# Patient Record
Sex: Male | Born: 2000
Health system: Southern US, Community
[De-identification: ages and names within clinical notes are randomized; demographics above are authoritative.]

## PROBLEM LIST (undated history)

## (undated) DIAGNOSIS — F909 Attention-deficit hyperactivity disorder, unspecified type: Secondary | ICD-10-CM

## (undated) DIAGNOSIS — F419 Anxiety disorder, unspecified: Secondary | ICD-10-CM

## (undated) DIAGNOSIS — N200 Calculus of kidney: Secondary | ICD-10-CM

## (undated) DIAGNOSIS — J45909 Unspecified asthma, uncomplicated: Secondary | ICD-10-CM

## (undated) HISTORY — DX: Attention-deficit hyperactivity disorder, unspecified type: F90.9

## (undated) HISTORY — PX: OTHER SURGICAL HISTORY: SHX169

## (undated) HISTORY — DX: Anxiety disorder, unspecified: F41.9

## (undated) HISTORY — DX: Calculus of kidney: N20.0

## (undated) HISTORY — PX: TONSILLECTOMY: SUR1361

## (undated) HISTORY — PX: LITHOTRIPSY: SUR834

## (undated) HISTORY — PX: TYMPANOSTOMY TUBE PLACEMENT: SHX32

---
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
## 2019-01-07 ENCOUNTER — Telehealth: Payer: Self-pay | Admitting: Family Medicine

## 2019-01-07 ENCOUNTER — Ambulatory Visit (INDEPENDENT_AMBULATORY_CARE_PROVIDER_SITE_OTHER): Payer: Medicaid Other | Admitting: Family Medicine

## 2019-01-07 ENCOUNTER — Ambulatory Visit
Admission: RE | Admit: 2019-01-07 | Discharge: 2019-01-07 | Disposition: A | Discharge status: Home / Self Care | Payer: Medicaid Other | Source: Ambulatory Visit | Attending: Family Medicine | Admitting: Family Medicine

## 2019-01-07 ENCOUNTER — Other Ambulatory Visit: Payer: Self-pay

## 2019-01-07 ENCOUNTER — Encounter: Payer: Self-pay | Admitting: Family Medicine

## 2019-01-07 VITALS — BP 108/76 | HR 116 | Temp 101.1°F | Resp 18 | Wt 161.0 lb

## 2019-01-07 DIAGNOSIS — J40 Bronchitis, not specified as acute or chronic: Secondary | ICD-10-CM

## 2019-01-07 DIAGNOSIS — R509 Fever, unspecified: Secondary | ICD-10-CM

## 2019-01-07 DIAGNOSIS — R05 Cough: Secondary | ICD-10-CM | POA: Insufficient documentation

## 2019-01-07 DIAGNOSIS — M778 Other enthesopathies, not elsewhere classified: Secondary | ICD-10-CM

## 2019-01-07 DIAGNOSIS — R059 Cough, unspecified: Secondary | ICD-10-CM

## 2019-01-07 DIAGNOSIS — J45909 Unspecified asthma, uncomplicated: Secondary | ICD-10-CM

## 2019-01-07 LAB — POCT INFLUENZA A/B
INFLUENZA B, POC: NEGATIVE
Influenza A, POC: NEGATIVE

## 2019-01-07 MED ORDER — BECLOMETHASONE DIPROP HFA 40 MCG/ACT IN AERB: 2.0000 | INHALATION_SPRAY | Freq: Every day | RESPIRATORY_TRACT | 12 refills | Status: DC

## 2019-01-07 MED ORDER — ALBUTEROL SULFATE HFA 108 (90 BASE) MCG/ACT IN AERS: INHALATION_SPRAY | RESPIRATORY_TRACT | 3 refills | Status: DC

## 2019-01-07 MED ORDER — AZITHROMYCIN 250 MG PO TABS: ORAL_TABLET | ORAL | 0 refills | Status: AC

## 2019-01-07 NOTE — Progress Notes (Addendum)
Patient: Joshua Green Male    DOB: 11/25/2001   18 y.o.   MRN: [id number] Visit Date: 01/07/2019  Today's Provider: Mila Merry, MD   Chief Complaint  Patient presents with  . Arm Pain    x 3 days  . Cough    x 1 month   Subjective:     Arm Pain   Incident onset: 3 days ago. The injury mechanism is unknown (patient does weight lifting at school). The pain has been constant since the incident. The symptoms are aggravated by movement. He has tried nothing for the symptoms.  Cough  This is a recurrent problem. Episode onset: 1 month ago. The problem has been gradually worsening (symptoms worsened 3 days ago). The cough is productive of sputum (light green sputum). Associated symptoms include a fever (99.0), headaches, myalgias, nasal congestion, postnasal drip and rhinorrhea. Pertinent negatives include no chills, ear congestion, ear pain, hemoptysis, sore throat, shortness of breath, sweats or wheezing. Treatments tried: Mucinex. The treatment provided mild relief.   He has long history of asthma. Previously controlled on fluticasone maintenance inhaler. However his insurance no longer covers it. Since being off of maintenance inhaler he has been having to use albuterol inhaler 2-3 times a week. Has been taking montelukast and cetirizine consistently.   Allergies  Allergen Reactions  . Amoxicillin-Pot Clavulanate   . Ceftin  [Cefuroxime Axetil] Rash     Current Outpatient Medications:  .  albuterol (PROAIR HFA) 108 (90 BASE) MCG/ACT inhaler, Inhale 2 puffs into the lungs. before exercise, and as needed every 6 hours, Disp: , Rfl:  .  Beclomethasone Diprop HFA (QVAR REDIHALER) 40 MCG/ACT AERB, Inhale 2 puffs into the lungs daily., Disp: 10.6 g, Rfl: 12 .  cetirizine (ZYRTEC) 10 MG tablet, Take 1 tablet (10 mg total) by mouth daily., Disp: 30 tablet, Rfl: 6 .  fluticasone (FLONASE ALLERGY RELIEF) 50 MCG/ACT nasal spray, Place 2 sprays into both nostrils daily., Disp: 16 g,  Rfl: 0 .  fluticasone (FLOVENT HFA) 110 MCG/ACT inhaler, Inhale 2 puffs into the lungs daily., Disp: 1 Inhaler, Rfl: 12 .  ibuprofen (ADVIL,MOTRIN) 600 MG tablet, Take 600 mg by mouth every 6 (six) hours as needed., Disp: , Rfl:  .  methylphenidate (CONCERTA) 54 MG PO CR tablet, Take 1 tablet (54 mg total) by mouth every morning., Disp: 30 tablet, Rfl: 0 .  montelukast (SINGULAIR) 10 MG tablet, TAKE 1 TABLET BY MOUTH EVERY DAY, Disp: 30 tablet, Rfl: 11 .  potassium citrate (UROCIT-K) 10 MEQ (1080 MG) SR tablet, TAKE 1 TABLET TWICE DAILY, Disp: 60 tablet, Rfl: 11 .  sertraline (ZOLOFT) 25 MG tablet, Take 1 tablet (25 mg total) by mouth daily., Disp: 30 tablet, Rfl: 5  Review of Systems  Constitutional: Positive for fatigue and fever (99.0). Negative for chills.  HENT: Positive for postnasal drip and rhinorrhea. Negative for ear pain and sore throat.   Respiratory: Positive for cough. Negative for hemoptysis, shortness of breath and wheezing.   Musculoskeletal: Positive for myalgias.  Neurological: Positive for headaches.    Social History   Tobacco Use  . Smoking status: Never Smoker  . Smokeless tobacco: Never Used  Substance Use Topics  . Alcohol use: No      Objective:   BP 108/76 (BP Location: Left Arm, Patient Position: Sitting, Cuff Size: Normal)   Pulse (!) 116   Temp (!) 101.1 F (38.4 C)   Resp 18   Wt 161 lb (  73 kg)   SpO2 96% Comment: room air  BMI 27.64 kg/m    Physical Exam  General Appearance:    Alert, cooperative, no distress  HENT:   ENT exam normal, no neck nodes or sinus tenderness  Eyes:    PERRL, conjunctiva/corneas clear, EOM's intact       Lungs:     Occasional expiratory wheeze, no rales, , respirations unlabored  Heart:    Regular rate and rhythm  Neurologic:   Awake, alert, oriented x 3. No apparent focal neurological           defect.   MS:   Tender over posterior distal upper arm. Pain with elbow extension.    Results for orders placed or  performed in visit on 01/07/19  POCT Influenza A/B  Result Value Ref Range   Influenza A, POC Negative Negative   Influenza B, POC Negative Negative     Dg Chest 2 View  Result Date: 01/07/2019 CLINICAL DATA:  Nonproductive cough for 2 months.  Asthma. EXAM: CHEST - 2 VIEW COMPARISON:  08/08/2017 FINDINGS: The heart size and mediastinal contours are within normal limits. Both lungs are clear. The visualized skeletal structures are unremarkable. IMPRESSION: Normal exam. Electronically Signed   By: Myles RosenthalJohn  Stahl M.D.   On: 01/07/2019 16:18       Assessment & Plan    1. Cough  - DG Chest 2 View; Future - POCT Influenza A/B  2. Fever, unspecified fever cause   3. Bronchitis  - azithromycin (ZITHROMAX) 250 MG tablet; 2 by mouth today, then 1 daily for 4 days  Dispense: 6 tablet; Refill: 0  4. Tendonitis of elbow, left  Counseled regarding relative rest, application of ice and OTC NSAIDs.   5. Asthma Worse since being off of maintenance inhaler due to failure of insurance to cover medication. Now requiring rescue inhaler nearly every day.  It looks like QVar is on formulary. New prescription for Qvar 40 sent to pharmacy.     Mila Merryonald Zalen Sequeira, MD  [facility]

## 2019-01-07 NOTE — Telephone Encounter (Signed)
Mom calling back for chest xray report from this morning  Cb#[phone]  thanks teri

## 2019-01-07 NOTE — Telephone Encounter (Signed)
Please see result note 

## 2019-01-07 NOTE — Telephone Encounter (Signed)
Patient's mom Dawn advised as verbally voiced understanding. Dawn states that she forgot to mention that he needs refills on his inhalers. She also states that he needs a new nebulizer machine so that he can do breathing treatments in the future. Please advise.

## 2019-01-07 NOTE — Telephone Encounter (Signed)
Calling for chest Xray results. Please call mother of pt back asap.  Thanks, [employer]

## 2019-01-07 NOTE — Patient Instructions (Signed)
.   Go to the [facility] on [address] for a chest Xray   Take 3 (three) 200mg  ibuprofen tablets every 4 hours for the next 2-3 days for your arm   Apply ice pack to your arm twice a day every day for the next week, and after weight lifting   Avoid elbow extension exercises for 7-10 days

## 2019-01-07 NOTE — Telephone Encounter (Signed)
-----   Message from Malva Limesonald E Fisher, MD sent at 01/07/2019  5:00 PM EST ----- [facility] [address]

## 2019-01-08 ENCOUNTER — Emergency Department
Admission: EM | Admit: 2019-01-08 | Discharge: 2019-01-08 | Disposition: A | Discharge status: Home / Self Care | Payer: Medicaid Other | Attending: Emergency Medicine | Admitting: Emergency Medicine

## 2019-01-08 ENCOUNTER — Other Ambulatory Visit: Payer: Self-pay

## 2019-01-08 DIAGNOSIS — Z5321 Procedure and treatment not carried out due to patient leaving prior to being seen by health care provider: Secondary | ICD-10-CM | POA: Insufficient documentation

## 2019-01-08 DIAGNOSIS — R05 Cough: Secondary | ICD-10-CM | POA: Diagnosis not present

## 2019-01-08 NOTE — ED Triage Notes (Addendum)
Pt states was diagnosed with bronchitis yesterday. Mother states was started on z pack for same. Pt states he continues to cough this am. Pt appears in no acute distress. Pt was tested for flu at urgent care with negative results per mother. Pt masked in triage.

## 2019-01-26 ENCOUNTER — Other Ambulatory Visit: Payer: Self-pay | Admitting: Family Medicine

## 2019-01-26 MED ORDER — METHYLPHENIDATE HCL ER (OSM) 54 MG PO TBCR: 54.0000 mg | EXTENDED_RELEASE_TABLET | ORAL | 0 refills | Status: DC

## 2019-01-26 NOTE — Telephone Encounter (Signed)
Pt needing a refill on: methylphenidate (CONCERTA) 54 MG PO CR tablet  Please fill at: [facility] [address] ([address]), [place] - [address] [phone] (Phone) [phone] (Fax)   Thanks, [employer]

## 2019-02-16 ENCOUNTER — Other Ambulatory Visit: Payer: Self-pay | Admitting: Family Medicine

## 2019-02-28 ENCOUNTER — Other Ambulatory Visit: Payer: Self-pay

## 2019-02-28 MED ORDER — METHYLPHENIDATE HCL ER (OSM) 54 MG PO TBCR: 54.0000 mg | EXTENDED_RELEASE_TABLET | ORAL | 0 refills | Status: DC

## 2019-02-28 NOTE — Telephone Encounter (Signed)
Patient's mom Dawn called requsting a refill. ([facility] [address]

## 2019-03-28 ENCOUNTER — Other Ambulatory Visit: Payer: Self-pay | Admitting: Family Medicine

## 2019-03-28 MED ORDER — METHYLPHENIDATE HCL ER (OSM) 54 MG PO TBCR: 54.0000 mg | EXTENDED_RELEASE_TABLET | ORAL | 0 refills | Status: DC

## 2019-03-28 NOTE — Telephone Encounter (Signed)
Pt needing a refill on:  methylphenidate (CONCERTA) 54 MG PO CR tablet  Please fill at:  [facility] [address] ([address]), [place] - [address] [phone] (Phone) [phone] (Fax)   Thanks, [employer]

## 2019-04-12 ENCOUNTER — Encounter: Payer: Self-pay | Admitting: Emergency Medicine

## 2019-04-12 ENCOUNTER — Other Ambulatory Visit: Payer: Self-pay

## 2019-04-12 DIAGNOSIS — R1013 Epigastric pain: Secondary | ICD-10-CM | POA: Diagnosis not present

## 2019-04-12 DIAGNOSIS — J45909 Unspecified asthma, uncomplicated: Secondary | ICD-10-CM | POA: Diagnosis not present

## 2019-04-12 DIAGNOSIS — R1010 Upper abdominal pain, unspecified: Secondary | ICD-10-CM | POA: Diagnosis present

## 2019-04-12 DIAGNOSIS — Z79899 Other long term (current) drug therapy: Secondary | ICD-10-CM | POA: Diagnosis not present

## 2019-04-12 DIAGNOSIS — Z20828 Contact with and (suspected) exposure to other viral communicable diseases: Secondary | ICD-10-CM | POA: Diagnosis not present

## 2019-04-12 LAB — URINALYSIS, COMPLETE (UACMP) WITH MICROSCOPIC
Bacteria, UA: NONE SEEN
Bilirubin Urine: NEGATIVE
Glucose, UA: NEGATIVE mg/dL
Hgb urine dipstick: NEGATIVE
Ketones, ur: NEGATIVE mg/dL
Leukocytes,Ua: NEGATIVE
Nitrite: NEGATIVE
Protein, ur: NEGATIVE mg/dL
Specific Gravity, Urine: 1.021 (ref 1.005–1.030)
Squamous Epithelial / HPF: NONE SEEN (ref 0–5)
pH: 6 (ref 5.0–8.0)

## 2019-04-12 LAB — CBC WITH DIFFERENTIAL/PLATELET
Abs Immature Granulocytes: 0.03 10*3/uL (ref 0.00–0.07)
Basophils Absolute: 0 10*3/uL (ref 0.0–0.1)
Basophils Relative: 0 %
Eosinophils Absolute: 0.1 10*3/uL (ref 0.0–0.5)
Eosinophils Relative: 1 %
HCT: 49 % (ref 39.0–52.0)
Hemoglobin: 16 g/dL (ref 13.0–17.0)
Immature Granulocytes: 0 %
Lymphocytes Relative: 20 %
Lymphs Abs: 2.4 10*3/uL (ref 0.7–4.0)
MCH: 25.4 pg — ABNORMAL LOW (ref 26.0–34.0)
MCHC: 32.7 g/dL (ref 30.0–36.0)
MCV: 77.9 fL — ABNORMAL LOW (ref 80.0–100.0)
Monocytes Absolute: 0.7 10*3/uL (ref 0.1–1.0)
Monocytes Relative: 6 %
Neutro Abs: 8.7 10*3/uL — ABNORMAL HIGH (ref 1.7–7.7)
Neutrophils Relative %: 73 %
Platelets: 292 10*3/uL (ref 150–400)
RBC: 6.29 MIL/uL — ABNORMAL HIGH (ref 4.22–5.81)
RDW: 13.5 % (ref 11.5–15.5)
WBC: 12 10*3/uL — ABNORMAL HIGH (ref 4.0–10.5)
nRBC: 0 % (ref 0.0–0.2)

## 2019-04-12 LAB — COMPREHENSIVE METABOLIC PANEL
ALT: 25 U/L (ref 0–44)
AST: 28 U/L (ref 15–41)
Albumin: 5.1 g/dL — ABNORMAL HIGH (ref 3.5–5.0)
Alkaline Phosphatase: 93 U/L (ref 38–126)
Anion gap: 10 (ref 5–15)
BUN: 18 mg/dL (ref 6–20)
CO2: 28 mmol/L (ref 22–32)
Calcium: 9.5 mg/dL (ref 8.9–10.3)
Chloride: 101 mmol/L (ref 98–111)
Creatinine, Ser: 0.79 mg/dL (ref 0.61–1.24)
GFR calc Af Amer: >60 mL/min (ref >60)
GFR calc non Af Amer: >60 mL/min (ref >60)
Glucose, Bld: 79 mg/dL (ref 70–99)
Potassium: 3.7 mmol/L (ref 3.5–5.1)
Sodium: 139 mmol/L (ref 135–145)
Total Bilirubin: 0.6 mg/dL (ref 0.3–1.2)
Total Protein: 8.1 g/dL (ref 6.5–8.1)

## 2019-04-12 LAB — TROPONIN I: Troponin I: <0.03 ng/mL (ref <0.03)

## 2019-04-12 LAB — LIPASE, BLOOD: Lipase: 23 U/L (ref 11–51)

## 2019-04-12 NOTE — ED Triage Notes (Signed)
Patient ambulatory to triage with steady gait, without difficulty or distress noted; pt reports upper abd since yesterday with no accomp symptoms

## 2019-04-13 ENCOUNTER — Emergency Department
Admission: EM | Admit: 2019-04-13 | Discharge: 2019-04-13 | Disposition: A | Discharge status: Home / Self Care | Payer: Medicaid Other | Attending: Emergency Medicine | Admitting: Emergency Medicine

## 2019-04-13 DIAGNOSIS — R1013 Epigastric pain: Secondary | ICD-10-CM

## 2019-04-13 NOTE — Discharge Instructions (Signed)

## 2019-04-13 NOTE — ED Notes (Signed)
PT denies any N/V/D. States has been getting intermittent gastric area pains since yesterday. States got worse tonight.

## 2019-04-13 NOTE — ED Provider Notes (Signed)
[facility] Emergency Department Provider Note  ____________________________________________   First MD Initiated Contact with Patient 04/13/19 0143     (approximate)  I have reviewed the triage vital signs and the nursing notes.   HISTORY  Chief Complaint Abdominal Pain    HPI Joshua Green is a 18 y.o. male with medical history as listed below who presents for evaluation of abdominal pain.  He says that it started this morning around 9:00 and happens on and off during the course the day.  It feels like a mild to moderate sharp pain in the upper part of his abdomen just below the sternum.   Nothing particular makes it better and he feels that a little bit more if he is sitting up or leaning forward and then he lies back.  He was not sure if maybe he pulled a muscle.  He has been eating normally today and it is not worse after he eats.  He has had no nausea, vomiting, nor diarrhea.  He denies any recent fever/chills, sore throat, chest pain, shortness of breath, cough, and dysuria.  The pain does not radiate.  He occasionally has had some acid reflux and it feels similar to that but not exactly the same.  He is currently pain-free and has not had any pain for a couple of hours.         Past Medical History:  Diagnosis Date  . ADHD (attention deficit hyperactivity disorder)   . Anxiety   . Asthma   . Kidney stone    Following at [facility]  . Premature baby    born at 41 weeks, NICU for 57 days    Patient Active Problem List   Diagnosis Date Noted  . Chronic constipation 02/29/2016  . Allergic rhinitis 08/14/2015  . Anxiety 08/14/2015  . Asthma 08/14/2015  . History of kidney stones 08/14/2015  . Insomnia 08/14/2015  . Pelvic floor dysfunction 07/01/2013  . 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 Recurrent nephrolithiasis 07/01/2012  . ENURESIS 04/10/2010  . ADHD (attention deficit hyperactivity disorder) 04/10/2010    Past Surgical History:  Procedure  Laterality Date  . LITHOTRIPSY    . OTHER SURGICAL HISTORY     blood transfusion as an infant  . TONSILLECTOMY     Dr. Jenne Campus  . TYMPANOSTOMY TUBE PLACEMENT     x3    Prior to Admission medications   Medication Sig Start Date End Date Taking? Authorizing Provider  albuterol [facility] HFA) 108 (90 Base) MCG/ACT inhaler before exercise, and as needed every 6 hours 01/07/19   Malva Limes, MD  beclomethasone (QVAR REDIHALER) 40 MCG/ACT inhaler Inhale 2 puffs into the lungs daily. 01/07/19   Malva Limes, MD  cetirizine (ZYRTEC) 10 MG tablet Take 1 tablet (10 mg total) by mouth daily. 09/16/17   Malva Limes, MD  fluticasone [facility] ALLERGY RELIEF) 50 MCG/ACT nasal spray Place 2 sprays into both nostrils daily. 01/19/18   Malva Limes, MD  fluticasone (FLOVENT HFA) 110 MCG/ACT inhaler Inhale 2 puffs into the lungs daily. 06/22/17   Malva Limes, MD  ibuprofen (ADVIL,MOTRIN) 600 MG tablet Take 600 mg by mouth every 6 (six) hours as needed.    [provider]  methylphenidate (CONCERTA) 54 MG PO CR tablet Take 1 tablet (54 mg total) by mouth every morning. 03/28/19   Malva Limes, MD  montelukast (SINGULAIR) 10 MG tablet TAKE 1 TABLET BY MOUTH EVERY DAY 12/21/18   Malva Limes,  MD  potassium citrate (UROCIT-K) 10 MEQ (1080 MG) SR tablet TAKE 1 TABLET TWICE DAILY 09/24/18   Malva Limes, MD  sertraline (ZOLOFT) 25 MG tablet TAKE 1 TABLET BY MOUTH EVERY DAY 02/16/19   Malva Limes, MD    Allergies Amoxicillin-pot clavulanate and Ceftin  [cefuroxime axetil]  Family History  Problem Relation Age of Onset  . Diabetes Mother   . Stroke Mother   . Coronary artery disease Mother   . Colon polyps Mother   . Bipolar disorder Mother   . Kidney disease Mother   . Diabetes Maternal Grandfather   . Stroke Maternal Grandfather   . Hypertension Maternal Grandfather   . Colon cancer Maternal Grandfather   . Coronary artery disease Maternal Grandfather   . Heart  disease Maternal Grandfather   . Heart attack Maternal Grandfather   . Depression Maternal Grandmother   . Bipolar disorder Maternal Grandmother     Social History Social History   Tobacco Use  . Smoking status: Never Smoker  . Smokeless tobacco: Never Used  Substance Use Topics  . Alcohol use: No  . Drug use: No    Review of Systems Constitutional: No fever/chills Eyes: No visual changes. ENT: No sore throat. Cardiovascular: Denies chest pain. Respiratory: Denies shortness of breath. Gastrointestinal: Intermittent sharp upper abdominal pain just below the sternum.  No nausea, no vomiting.  No diarrhea.  No constipation. Genitourinary: Negative for dysuria. Musculoskeletal: Negative for neck pain.  Negative for back pain. Integumentary: Negative for rash. Neurological: Negative for headaches, focal weakness or numbness.   ____________________________________________   PHYSICAL EXAM:  VITAL SIGNS: ED Triage Vitals  Enc Vitals Group     BP 04/12/19 2234 (!) 150/96     Pulse Rate 04/12/19 2234 92     Resp 04/12/19 2234 18     Temp 04/12/19 2234 97.8 F (36.6 C)     Temp Source 04/12/19 2234 Oral     SpO2 04/12/19 2234 99 %     Weight 04/12/19 2231 68 kg (150 lb)     Height 04/12/19 2231 1.626 m ( )     Head Circumference --      Peak Flow --      Pain Score 04/12/19 2231 7     Pain Loc --      Pain Edu? --      Excl. in GC? --     Constitutional: Alert and oriented. Well appearing and in no acute distress. Eyes: Conjunctivae are normal.  Head: Atraumatic. Nose: No congestion/rhinnorhea. Mouth/Throat: Mucous membranes are moist. Neck: No stridor.  No meningeal signs.   Cardiovascular: Normal rate, regular rhythm. Good peripheral circulation. Grossly normal heart sounds. Respiratory: Normal respiratory effort.  No retractions. No audible wheezing. Gastrointestinal: Soft and nontender. No distention.  Negative Murphy sign, no tenderness at McBurney's  point.  No tenderness at all in the epigastrium. Musculoskeletal: No lower extremity tenderness nor edema. No gross deformities of extremities. Neurologic:  Normal speech and language. No gross focal neurologic deficits are appreciated.  Skin:  Skin is warm, dry and intact. No rash noted. Psychiatric: Mood and affect are normal. Speech and behavior are normal.  ____________________________________________   LABS (all labs ordered are listed, but only abnormal results are displayed)  Labs Reviewed  CBC WITH DIFFERENTIAL/PLATELET - Abnormal; Notable for the following components:      Result Value   WBC 12.0 (*)    RBC 6.29 (*)    MCV 77.9 (*)  MCH 25.4 (*)    Neutro Abs 8.7 (*)    All other components within normal limits  COMPREHENSIVE METABOLIC PANEL - Abnormal; Notable for the following components:   Albumin 5.1 (*)    All other components within normal limits  URINALYSIS, COMPLETE (UACMP) WITH MICROSCOPIC - Abnormal; Notable for the following components:   Color, Urine YELLOW (*)    APPearance CLEAR (*)    All other components within normal limits  LIPASE, BLOOD  TROPONIN I   ____________________________________________  EKG  ED ECG REPORT I, Loleta Roseory Geniece Akers, the attending physician, personally viewed and interpreted this ECG.  Date: 04/12/2019 EKG Time: 22: 34 Rate: 86 Rhythm: normal sinus rhythm QRS Axis: normal Intervals: normal ST/T Wave abnormalities: normal Narrative Interpretation: no evidence of acute ischemia  ____________________________________________  RADIOLOGY   ED MD interpretation: No indication for imaging  Official radiology report(s): No results found.  ____________________________________________   PROCEDURES   Procedure(s) performed (including Critical Care):  Procedures   ____________________________________________   INITIAL IMPRESSION / MDM / ASSESSMENT AND PLAN / ED COURSE  As part of my medical decision making, I  reviewed the following data within the electronic MEDICAL RECORD NUMBER Nursing notes reviewed and incorporated, Labs reviewed , EKG interpreted , Old chart reviewed, Notes from prior ED visits and [place] Controlled Substance Database      *Cordelia PocheJacob B Holderness was evaluated in Emergency Department on 04/13/2019 for the symptoms described in the history of present illness. He was evaluated in the context of the global COVID-19 pandemic, which necessitated consideration that the patient might be at risk for infection with the SARS-CoV-2 virus that causes COVID-19. Institutional protocols and algorithms that pertain to the evaluation of patients at risk for COVID-19 are in a state of rapid change based on information released by regulatory bodies including the CDC and federal and state organizations. These policies and algorithms were followed during the patient's care in the ED.*  Differential diagnosis includes, but is not limited to, musculoskeletal pain, acid reflux, biliary colic, pancreatitis, nonspecific viral infection.  The patient is well-appearing and in no distress with normal vital signs and he is pain-free currently.  Nonischemic EKG, completely reassuring lab work including a normal comprehensive metabolic panel and lipase with only very mild leukocytosis of 12 which is nonspecific.  He is currently pain-free and I think that he may be having some acid reflux.  I suggested an ultrasound in the unlikely event that he has biliary colic but he would prefer to go home at this time and I think that is appropriate.  I encouraged over-the-counter ibuprofen and Tylenol as needed and follow-up with his outpatient doctor.  I gave my usual and customary return precautions.      ____________________________________________  FINAL CLINICAL IMPRESSION(S) / ED DIAGNOSES  Final diagnoses:  Epigastric pain     MEDICATIONS GIVEN DURING THIS VISIT:  Medications - No data to display   ED Discharge Orders     None       Note:  This document was prepared using Dragon voice recognition software and may include unintentional dictation errors.   Loleta RoseForbach, Ibraheem Voris, MD 04/13/19 [phone]

## 2019-04-15 ENCOUNTER — Ambulatory Visit (INDEPENDENT_AMBULATORY_CARE_PROVIDER_SITE_OTHER): Payer: Medicaid Other | Admitting: Family Medicine

## 2019-04-15 ENCOUNTER — Other Ambulatory Visit: Payer: Self-pay

## 2019-04-15 ENCOUNTER — Encounter: Payer: Self-pay | Admitting: Family Medicine

## 2019-04-15 VITALS — BP 122/80 | HR 78 | Temp 97.8°F | Ht 64.0 in | Wt 160.4 lb

## 2019-04-15 DIAGNOSIS — K219 Gastro-esophageal reflux disease without esophagitis: Secondary | ICD-10-CM

## 2019-04-15 DIAGNOSIS — R1013 Epigastric pain: Secondary | ICD-10-CM | POA: Diagnosis not present

## 2019-04-15 MED ORDER — OMEPRAZOLE 40 MG PO CPDR: 40.0000 mg | DELAYED_RELEASE_CAPSULE | Freq: Every day | ORAL | 3 refills | Status: DC

## 2019-04-15 MED ORDER — HYOSCYAMINE SULFATE 0.125 MG SL SUBL: 0.1250 mg | SUBLINGUAL_TABLET | SUBLINGUAL | 0 refills | Status: DC | PRN

## 2019-04-15 NOTE — Progress Notes (Signed)
Patient: Joshua Green Male    DOB: Feb 11, 2001   17 y.o.   MRN: [id number] Visit Date: 04/15/2019  Today's Provider: Dortha Kern, PA   Chief Complaint  Patient presents with  . Abdominal Pain    upper mid abdomen 04/12/19   Subjective:     Abdominal Pain  This is a new problem. The current episode started in the past 7 days. The onset quality is sudden. The problem occurs intermittently. The most recent episode lasted 4 hours. The problem has been gradually worsening. The pain is located in the epigastric region. The pain is at a severity of 9/10. The pain is severe. The quality of the pain is sharp. The abdominal pain does not radiate. The pain is aggravated by eating. The pain is relieved by nothing. He has tried acetaminophen for the symptoms. The treatment provided no relief.   Past Medical History:  Diagnosis Date  . ADHD (attention deficit hyperactivity disorder)   . Anxiety   . Asthma   . Kidney stone    Following at [facility]  . Premature baby    born at 40 weeks, NICU for 57 days   Past Surgical History:  Procedure Laterality Date  . LITHOTRIPSY    . OTHER SURGICAL HISTORY     blood transfusion as an infant  . TONSILLECTOMY     Dr. Jenne Campus  . TYMPANOSTOMY TUBE PLACEMENT     x3   Family History  Problem Relation Age of Onset  . Diabetes Mother   . Stroke Mother   . Coronary artery disease Mother   . Colon polyps Mother   . Bipolar disorder Mother   . Kidney disease Mother   . Diabetes Maternal Grandfather   . Stroke Maternal Grandfather   . Hypertension Maternal Grandfather   . Colon cancer Maternal Grandfather   . Coronary artery disease Maternal Grandfather   . Heart disease Maternal Grandfather   . Heart attack Maternal Grandfather   . Depression Maternal Grandmother   . Bipolar disorder Maternal Grandmother    Allergies  Allergen Reactions  . Amoxicillin-Pot Clavulanate   . Ceftin  [Cefuroxime Axetil] Rash    Current Outpatient  Medications:  .  albuterol (PROAIR HFA) 108 (90 Base) MCG/ACT inhaler, before exercise, and as needed every 6 hours, Disp: 1 Inhaler, Rfl: 3 .  beclomethasone (QVAR REDIHALER) 40 MCG/ACT inhaler, Inhale 2 puffs into the lungs daily., Disp: 10.6 g, Rfl: 12 .  cetirizine (ZYRTEC) 10 MG tablet, Take 1 tablet (10 mg total) by mouth daily., Disp: 30 tablet, Rfl: 6 .  fluticasone (FLONASE ALLERGY RELIEF) 50 MCG/ACT nasal spray, Place 2 sprays into both nostrils daily., Disp: 16 g, Rfl: 0 .  fluticasone (FLOVENT HFA) 110 MCG/ACT inhaler, Inhale 2 puffs into the lungs daily., Disp: 1 Inhaler, Rfl: 12 .  ibuprofen (ADVIL,MOTRIN) 600 MG tablet, Take 600 mg by mouth every 6 (six) hours as needed., Disp: , Rfl:  .  montelukast (SINGULAIR) 10 MG tablet, TAKE 1 TABLET BY MOUTH EVERY DAY, Disp: 30 tablet, Rfl: 11 .  potassium citrate (UROCIT-K) 10 MEQ (1080 MG) SR tablet, TAKE 1 TABLET TWICE DAILY, Disp: 60 tablet, Rfl: 11 .  sertraline (ZOLOFT) 25 MG tablet, TAKE 1 TABLET BY MOUTH EVERY DAY, Disp: 30 tablet, Rfl: 12 .  bisacodyl (DULCOLAX) 5 MG EC tablet, Take by mouth., Disp: , Rfl:  .  methylphenidate (CONCERTA) 54 MG PO CR tablet, Take 1 tablet (54 mg total) by mouth  every morning., Disp: 30 tablet, Rfl: 0  Review of Systems  Constitutional: Negative.   HENT: Negative.   Eyes: Negative.   Respiratory: Negative.   Cardiovascular: Negative.   Gastrointestinal: Positive for abdominal pain (upper mid abdominal area).  Endocrine: Negative.   Genitourinary: Negative.   Musculoskeletal: Negative.   Skin: Negative.   Allergic/Immunologic: Negative.   Neurological: Negative.   Hematological: Negative.   Psychiatric/Behavioral: Negative.    Social History   Tobacco Use  . Smoking status: Never Smoker  . Smokeless tobacco: Never Used  Substance Use Topics  . Alcohol use: No     Objective:   BP 122/80 (BP Location: Right Arm, Patient Position: Sitting, Cuff Size: Normal)   Pulse 78   Temp 97.8 F  (36.6 C) (Oral)   Ht 5\' 4"  (1.626 m)   Wt 160 lb 6.4 oz (72.8 kg)   SpO2 97%   BMI 27.53 kg/m  Vitals:   04/15/19 0900  BP: 122/80  Pulse: 78  Temp: 97.8 F (36.6 C)  TempSrc: Oral  SpO2: 97%  Weight: 160 lb 6.4 oz (72.8 kg)  Height: 5\' 4"  (1.626 m)   Physical Exam Constitutional:      General: He is not in acute distress.    Appearance: He is well-developed.  HENT:     Head: Normocephalic and atraumatic.     Right Ear: Hearing normal.     Left Ear: Hearing normal.     Nose: Nose normal.  Eyes:     General: Lids are normal. No scleral icterus.       Right eye: No discharge.        Left eye: No discharge.     Conjunctiva/sclera: Conjunctivae normal.  Cardiovascular:     Rate and Rhythm: Normal rate and regular rhythm.  Pulmonary:     Effort: Pulmonary effort is normal. No respiratory distress.  Abdominal:     General: Abdomen is flat. Bowel sounds are normal.     Palpations: Abdomen is soft.     Tenderness: There is abdominal tenderness in the epigastric area. There is no guarding.     Hernia: No hernia is present.     Comments: Slight epigastric soreness today. No masses or organomegaly.  Musculoskeletal: Normal range of motion.  Skin:    Findings: No lesion or rash.  Neurological:     Mental Status: He is alert and oriented to person, place, and time.  Psychiatric:        Speech: Speech normal.        Behavior: Behavior normal.        Thought Content: Thought content normal.       Assessment & Plan    1. Epigastric pain Started 04-12-19 without nausea, vomiting, diarrhea, melena, hematemesis or flatulence. Has not tried any medications other than some Tylenol. Went to the ER on 04-13-19 and all labs were essentially normal. Has some reflux intermittently. Denies alcohol or tobacco use. No illicit drugs. Discomfort not associated with specific foods. Will check H.pylori breath test, start PPI, given reflux handout and Levsin-SL for spasms. Restrict acidic or  spicy foods. Recheck pending lab reports. May need UGI or GI referral. - H. pylori breath test - hyoscyamine (LEVSIN SL) 0.125 MG SL tablet; Place 1 tablet (0.125 mg total) under the tongue every 4 (four) hours as needed.  Dispense: 30 tablet; Refill: 0 - omeprazole (PRILOSEC) 40 MG capsule; Take 1 capsule (40 mg total) by mouth daily.  Dispense: 30 capsule; Refill:  3  2. Gastroesophageal reflux disease, esophagitis presence not specified Has had some water brash when lying down or bending over. Has had to get up and "walk around" to get symptom to abate. Has not taken any antacids or PPI. Will treat with Omeprazole. Given handout on GERD treatment and diet restrictions. May need UGI/Ba swallow if no better with this regimen. - omeprazole (PRILOSEC) 40 MG capsule; Take 1 capsule (40 mg total) by mouth daily.  Dispense: 30 capsule; Refill: 3     Dortha Kernennis Jesscia Imm, PA  [facility]

## 2019-04-15 NOTE — Patient Instructions (Signed)
Gastroesophageal Reflux Disease, Adult Gastroesophageal reflux (GER) happens when acid from the stomach flows up into the tube that connects the mouth and the stomach (esophagus). Normally, food travels down the esophagus and stays in the stomach to be digested. However, when a person has GER, food and stomach acid sometimes move back up into the esophagus. If this becomes a more serious problem, the person may be diagnosed with a disease called gastroesophageal reflux disease (GERD). GERD occurs when the reflux:  Happens often.  Causes frequent or severe symptoms.  Causes problems such as damage to the esophagus. When stomach acid comes in contact with the esophagus, the acid may cause soreness (inflammation) in the esophagus. Over time, GERD may create small holes (ulcers) in the lining of the esophagus. What are the causes? This condition is caused by a problem with the muscle between the esophagus and the stomach (lower esophageal sphincter, or LES). Normally, the LES muscle closes after food passes through the esophagus to the stomach. When the LES is weakened or abnormal, it does not close properly, and that allows food and stomach acid to go back up into the esophagus. The LES can be weakened by certain dietary substances, medicines, and medical conditions, including:  Tobacco use.  Pregnancy.  Having a hiatal hernia.  Alcohol use.  Certain foods and beverages, such as coffee, chocolate, onions, and peppermint. What increases the risk? You are more likely to develop this condition if you:  Have an increased body weight.  Have a connective tissue disorder.  Use NSAID medicines. What are the signs or symptoms? Symptoms of this condition include:  Heartburn.  Difficult or painful swallowing.  The feeling of having a lump in the throat.  Abitter taste in the mouth.  Bad breath.  Having a large amount of saliva.  Having an upset or bloated  stomach.  Belching.  Chest pain. Different conditions can cause chest pain. Make sure you see your health care provider if you experience chest pain.  Shortness of breath or wheezing.  Ongoing (chronic) cough or a night-time cough.  Wearing away of tooth enamel.  Weight loss. How is this diagnosed? Your health care provider will take a medical history and perform a physical exam. To determine if you have mild or severe GERD, your health care provider may also monitor how you respond to treatment. You may also have tests, including:  A test to examine your stomach and esophagus with a small camera (endoscopy).  A test thatmeasures the acidity level in your esophagus.  A test thatmeasures how much pressure is on your esophagus.  A barium swallow or modified barium swallow test to show the shape, size, and functioning of your esophagus. How is this treated? The goal of treatment is to help relieve your symptoms and to prevent complications. Treatment for this condition may vary depending on how severe your symptoms are. Your health care provider may recommend:  Changes to your diet.  Medicine.  Surgery. Follow these instructions at home: Eating and drinking   Follow a diet as recommended by your health care provider. This may involve avoiding foods and drinks such as: ? Coffee and tea (with or without caffeine). ? Drinks that containalcohol. ? Energy drinks and sports drinks. ? Carbonated drinks or sodas. ? Chocolate and cocoa. ? Peppermint and mint flavorings. ? Garlic and onions. ? Horseradish. ? Spicy and acidic foods, including peppers, chili powder, curry powder, vinegar, hot sauces, and barbecue sauce. ? Citrus fruit juices and citrus   fruits, such as oranges, lemons, and limes. ? Tomato-based foods, such as red sauce, chili, salsa, and pizza with red sauce. ? Fried and fatty foods, such as donuts, french fries, potato chips, and high-fat dressings. ? High-fat  meats, such as hot dogs and fatty cuts of red and white meats, such as rib eye steak, sausage, ham, and bacon. ? High-fat dairy items, such as whole milk, butter, and cream cheese.  Eat small, frequent meals instead of large meals.  Avoid drinking large amounts of liquid with your meals.  Avoid eating meals during the 2-3 hours before bedtime.  Avoid lying down right after you eat.  Do not exercise right after you eat. Lifestyle   Do not use any products that contain nicotine or tobacco, such as cigarettes, e-cigarettes, and chewing tobacco. If you need help quitting, ask your health care provider.  Try to reduce your stress by using methods such as yoga or meditation. If you need help reducing stress, ask your health care provider.  If you are overweight, reduce your weight to an amount that is healthy for you. Ask your health care provider for guidance about a safe weight loss goal. General instructions  Pay attention to any changes in your symptoms.  Take over-the-counter and prescription medicines only as told by your health care provider. Do not take aspirin, ibuprofen, or other NSAIDs unless your health care provider told you to do so.  Wear loose-fitting clothing. Do not wear anything tight around your waist that causes pressure on your abdomen.  Raise (elevate) the head of your bed about 6 inches (15 cm).  Avoid bending over if this makes your symptoms worse.  Keep all follow-up visits as told by your health care provider. This is important. Contact a health care provider if:  You have: ? New symptoms. ? Unexplained weight loss. ? Difficulty swallowing or it hurts to swallow. ? Wheezing or a persistent cough. ? A hoarse voice.  Your symptoms do not improve with treatment. Get help right away if you:  Have pain in your arms, neck, jaw, teeth, or back.  Feel sweaty, dizzy, or light-headed.  Have chest pain or shortness of breath.  Vomit and your vomit looks  like blood or coffee grounds.  Faint.  Have stool that is bloody or black.  Cannot swallow, drink, or eat. Summary  Gastroesophageal reflux happens when acid from the stomach flows up into the esophagus. GERD is a disease in which the reflux happens often, causes frequent or severe symptoms, or causes problems such as damage to the esophagus.  Treatment for this condition may vary depending on how severe your symptoms are. Your health care provider may recommend diet and lifestyle changes, medicine, or surgery.  Contact a health care provider if you have new or worsening symptoms.  Take over-the-counter and prescription medicines only as told by your health care provider. Do not take aspirin, ibuprofen, or other NSAIDs unless your health care provider told you to do so.  Keep all follow-up visits as told by your health care provider. This is important. This information is not intended to replace advice given to you by your health care provider. Make sure you discuss any questions you have with your health care provider. Document Released: 08/27/2005 Document Revised: 05/26/2018 Document Reviewed: 05/26/2018 Elsevier Interactive Patient Education  2019 [employer]  

## 2019-04-16 LAB — H. PYLORI BREATH TEST: H pylori Breath Test: NEGATIVE

## 2019-04-18 ENCOUNTER — Telehealth: Payer: Self-pay

## 2019-04-18 ENCOUNTER — Telehealth: Payer: Self-pay | Admitting: Family Medicine

## 2019-04-18 DIAGNOSIS — R1013 Epigastric pain: Secondary | ICD-10-CM

## 2019-04-18 NOTE — Telephone Encounter (Signed)
LMTCB 04/18/2019  Thanks,   -Laura  

## 2019-04-18 NOTE — Telephone Encounter (Signed)
Patient notified of results.

## 2019-04-18 NOTE — Telephone Encounter (Signed)
Advised patient of results.  

## 2019-04-18 NOTE — Telephone Encounter (Signed)
Patients mother, Alvis Lemmings was in the office this morning reporting the patient is no better despite starting omeprazole. Maurine Minister had recommended, referral to GI, but want to go ahead and get abdominal ultrasound ordered first. Please advise patient, have entered order for ultrasound

## 2019-04-18 NOTE — Telephone Encounter (Signed)
-----   Message from Tamsen Roers, [place] sent at 04/18/2019  8:30 AM EDT ----- Negative H.pylori test (the bacteria that causes ulcers in GI tract). If improving with medications, continue for a month then recheck. If no better, will need referral to gastroenterologist.

## 2019-04-18 NOTE — Telephone Encounter (Signed)
Left voicemail message regarding lab results.

## 2019-04-19 NOTE — Telephone Encounter (Signed)
Patient advised.KW 

## 2019-04-21 ENCOUNTER — Ambulatory Visit: Payer: Medicaid Other | Attending: Family Medicine

## 2019-04-28 ENCOUNTER — Other Ambulatory Visit: Payer: Self-pay

## 2019-04-28 NOTE — Telephone Encounter (Signed)
Patient's mother dawn Jeoffrey Massed calling for refill request on the following medication; methylphenidate (CONCERTA) 54 MG PO CR tablet   Pharmacy: [id number] - [place] [place] - [address]

## 2019-04-29 MED ORDER — METHYLPHENIDATE HCL ER (OSM) 54 MG PO TBCR: 54.0000 mg | EXTENDED_RELEASE_TABLET | ORAL | 0 refills | Status: DC

## 2019-05-30 ENCOUNTER — Other Ambulatory Visit: Payer: Self-pay

## 2019-05-31 MED ORDER — METHYLPHENIDATE HCL ER (OSM) 54 MG PO TBCR: 54.0000 mg | EXTENDED_RELEASE_TABLET | ORAL | 0 refills | Status: DC

## 2019-06-27 ENCOUNTER — Other Ambulatory Visit: Payer: Self-pay | Admitting: Family Medicine

## 2019-06-27 MED ORDER — METHYLPHENIDATE HCL ER (OSM) 54 MG PO TBCR: 54.0000 mg | EXTENDED_RELEASE_TABLET | ORAL | 0 refills | Status: DC

## 2019-06-27 NOTE — Telephone Encounter (Signed)
Pt needs refill on Concerta 54 mg  Vashon

## 2019-07-12 ENCOUNTER — Other Ambulatory Visit: Payer: Self-pay | Admitting: Family Medicine

## 2019-07-12 DIAGNOSIS — K219 Gastro-esophageal reflux disease without esophagitis: Secondary | ICD-10-CM

## 2019-07-12 DIAGNOSIS — R1013 Epigastric pain: Secondary | ICD-10-CM

## 2019-07-12 MED ORDER — OMEPRAZOLE 40 MG PO CPDR: 40.0000 mg | DELAYED_RELEASE_CAPSULE | Freq: Every day | ORAL | 3 refills | Status: DC

## 2019-07-12 NOTE — Addendum Note (Signed)
Addended by: Ashley Royalty E on: 07/12/2019 03:15 PM   Modules accepted: Orders

## 2019-07-12 NOTE — Telephone Encounter (Signed)
[place] faxed refill request for the following medications:  omeprazole (PRILOSEC) 40 MG capsule   LOV: 04/15/2019 with Simona Huh Dr. Caryn Section is out of the office this week. Can another provider review request? Please advise. Thanks TNP

## 2019-07-29 ENCOUNTER — Other Ambulatory Visit: Payer: Self-pay

## 2019-07-29 MED ORDER — METHYLPHENIDATE HCL ER (OSM) 54 MG PO TBCR: 54.0000 mg | EXTENDED_RELEASE_TABLET | ORAL | 0 refills | Status: DC

## 2019-08-16 IMAGING — CR DG CHEST 2V
2 series · 2 of 2 positions shown · non-contrast
Comparison: None.

CLINICAL DATA: Cough and congestion for 4 days.

EXAM:
CHEST  2 VIEW

[chest pa]
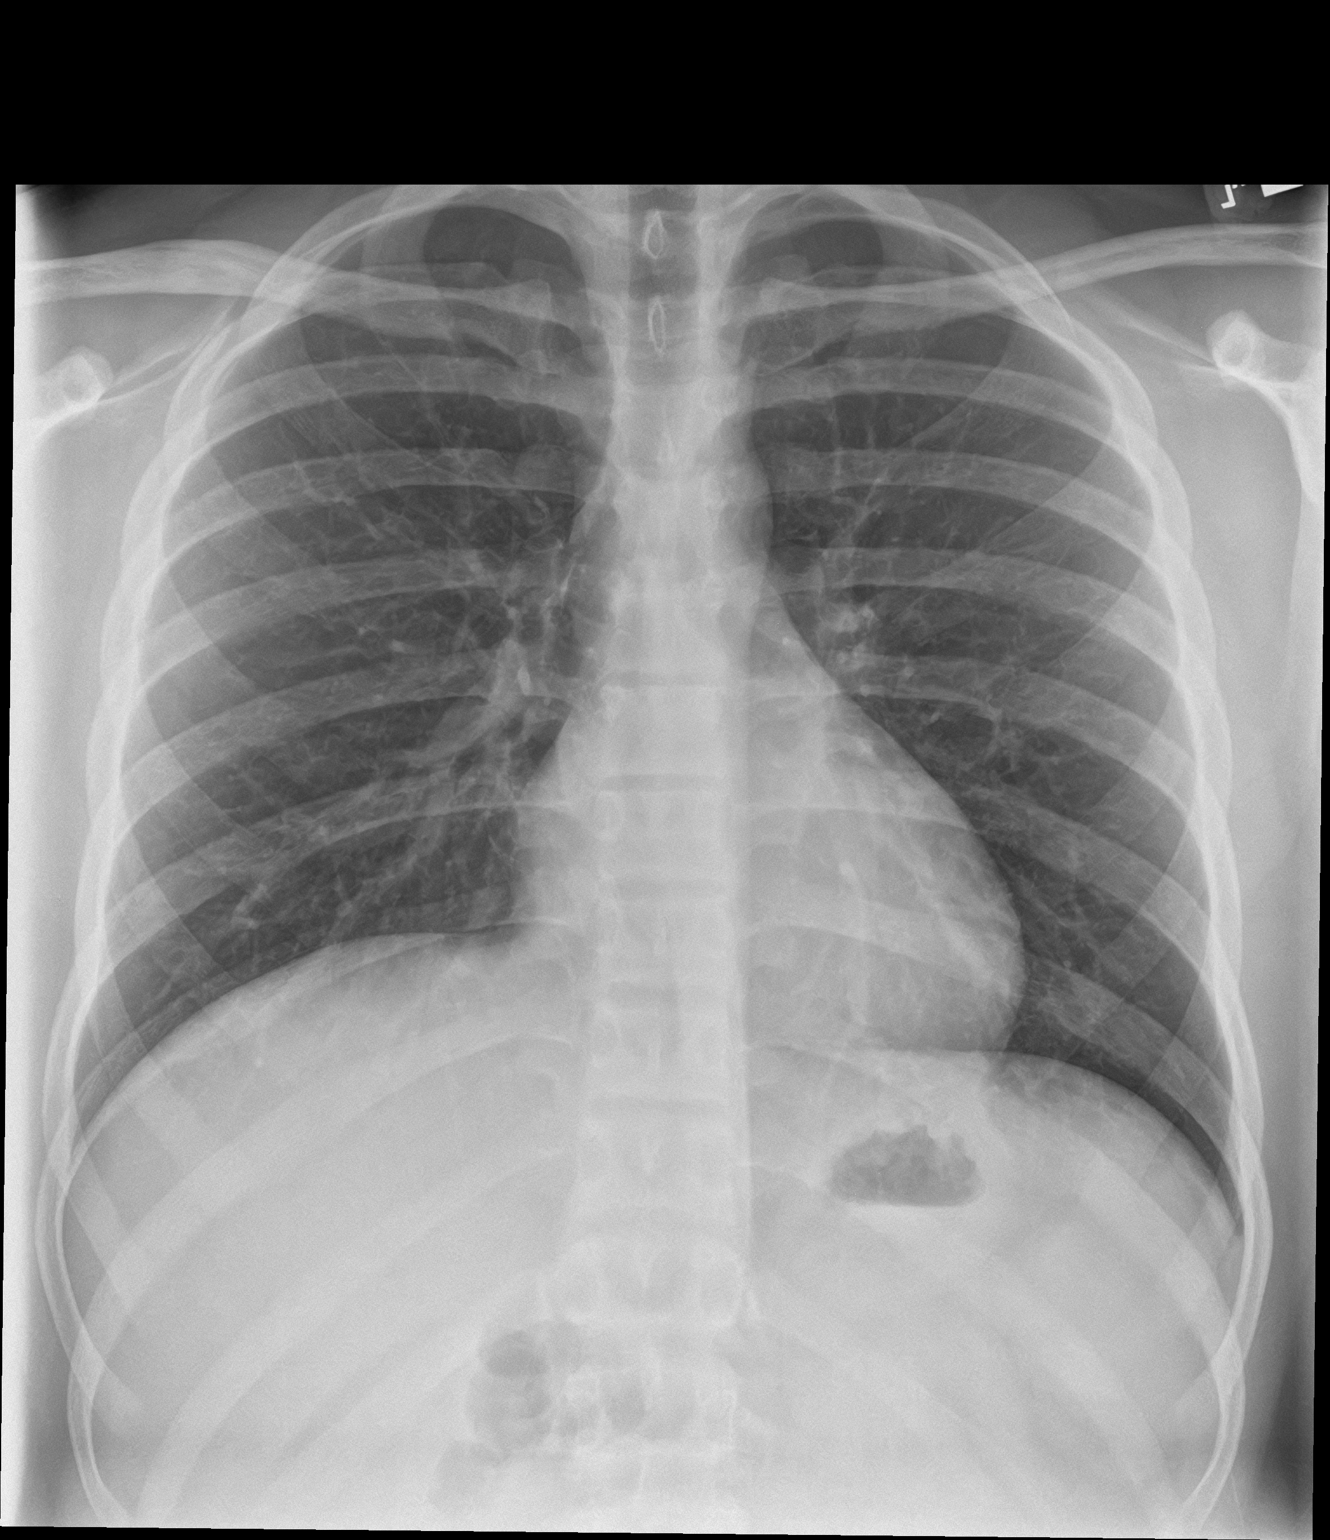

[chest lat]
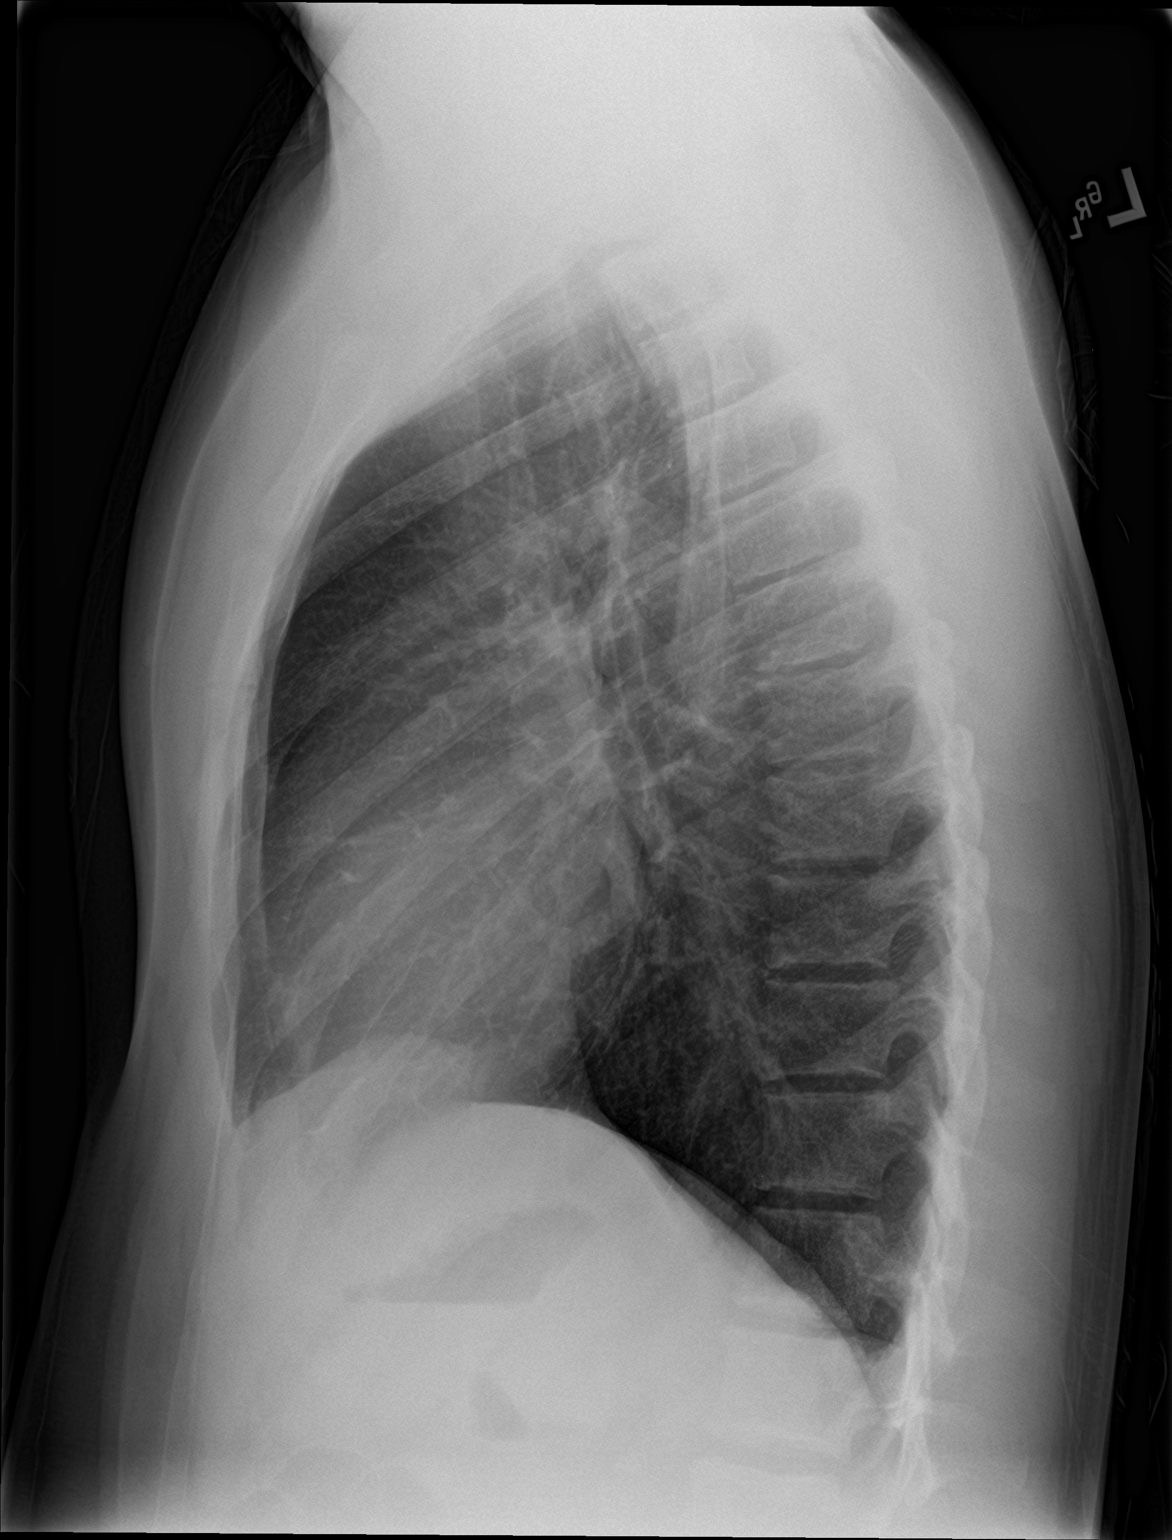

[2 of 2 positions shown; findings below may reference images not displayed]

FINDINGS: Cardiomediastinal silhouette is normal. No pleural effusions or
focal consolidations. Trachea projects midline and there is no
pneumothorax. Soft tissue planes and included osseous structures are
non-suspicious.
IMPRESSION: Normal chest.

## 2019-08-26 ENCOUNTER — Other Ambulatory Visit: Payer: Self-pay

## 2019-08-26 NOTE — Telephone Encounter (Signed)
Patient's mother Arrie Aran) called and requested a refill on patient Concerta 54MG . Please advise.

## 2019-08-29 ENCOUNTER — Other Ambulatory Visit: Payer: Self-pay | Admitting: *Deleted

## 2019-08-29 NOTE — Telephone Encounter (Signed)
Patient is out of medication. Please advise refill?

## 2019-08-30 MED ORDER — METHYLPHENIDATE HCL ER (OSM) 54 MG PO TBCR: 54.0000 mg | EXTENDED_RELEASE_TABLET | ORAL | 0 refills | Status: DC

## 2019-08-30 NOTE — Telephone Encounter (Signed)
Pt is completely out of his medication.  He needs it for school  [place]

## 2019-09-26 ENCOUNTER — Other Ambulatory Visit: Payer: Self-pay | Admitting: Family Medicine

## 2019-09-26 MED ORDER — METHYLPHENIDATE HCL ER (OSM) 54 MG PO TBCR: 54.0000 mg | EXTENDED_RELEASE_TABLET | ORAL | 0 refills | Status: DC

## 2019-09-26 NOTE — Telephone Encounter (Signed)
Pt needs refill on his Delta  CB#  [phone]  Joshua Green

## 2019-09-26 NOTE — Telephone Encounter (Signed)
Last acute OV was on 04/15/2019. Last follow up OV was 10/21/2017. Last refill 08/30/2019

## 2019-10-28 ENCOUNTER — Other Ambulatory Visit: Payer: Self-pay | Admitting: Family Medicine

## 2019-10-28 NOTE — Telephone Encounter (Signed)
Requested medication (s) are due for refill today: yes  Requested medication (s) are on the active medication list: yes  Last refill:  09/26/2019  Future visit scheduled: no  Notes to clinic: Not Delegated    Requested Prescriptions  Pending Prescriptions Disp Refills   methylphenidate (CONCERTA) 54 MG PO CR tablet 30 tablet 0    Sig: Take 1 tablet (54 mg total) by mouth every morning.     Not Delegated - Psychiatry:  Stimulants/ADHD Failed - 10/28/2019 11:22 AM      Failed - This refill cannot be delegated      Failed - Urine Drug Screen completed in last 360 days.      Failed - Valid encounter within last 3 months    Recent Outpatient Visits          6 months ago Epigastric pain   [place], [place]   9 months ago Cough   [facility] Birdie Sons, MD   11 months ago URI, acute   [place], [place]   1 year ago Sinusitis, unspecified chronicity, unspecified location   [facility] Birdie Sons, MD   1 year ago Sinusitis, unspecified chronicity, unspecified location   [facility] Birdie Sons, MD

## 2019-10-28 NOTE — Telephone Encounter (Signed)
Medication Refill - Medication: methylphenidate (CONCERTA) 54 MG PO CR tablet  Has the patient contacted their pharmacy? no (Agent: If no, request that the patient contact the pharmacy for the refill.) (Agent: If yes, when and what did the pharmacy advise?)  Preferred Pharmacy (with phone number or street name):  [facility] [place] [phone] (Phone) [phone] (Fax)   Agent: Please be advised that RX refills may take up to 3 business days. We ask that you follow-up with your pharmacy. 3

## 2019-10-31 MED ORDER — METHYLPHENIDATE HCL ER (OSM) 54 MG PO TBCR: 54.0000 mg | EXTENDED_RELEASE_TABLET | ORAL | 0 refills | Status: DC

## 2019-10-31 NOTE — Telephone Encounter (Signed)
Patient's mother [facility] sent a patient email checking to see if refill can be sent to the pharmacy ASAP.

## 2019-11-28 ENCOUNTER — Other Ambulatory Visit: Payer: Self-pay | Admitting: Family Medicine

## 2019-11-28 MED ORDER — METHYLPHENIDATE HCL ER (OSM) 54 MG PO TBCR: 54.0000 mg | EXTENDED_RELEASE_TABLET | ORAL | 0 refills | Status: DC

## 2019-11-28 NOTE — Telephone Encounter (Signed)
Requested medication (s) are due for refill today: yes  Requested medication (s) are on the active medication list: yes  Last refill:  10/31/2019  Future visit scheduled: no  Notes to clinic:  This refill cannot be delegated    Requested Prescriptions  Pending Prescriptions Disp Refills   methylphenidate (CONCERTA) 54 MG PO CR tablet 30 tablet 0    Sig: Take 1 tablet (54 mg total) by mouth every morning.      Not Delegated - Psychiatry:  Stimulants/ADHD Failed - 11/28/2019  8:28 AM      Failed - This refill cannot be delegated      Failed - Urine Drug Screen completed in last 360 days.      Failed - Valid encounter within last 3 months    Recent Outpatient Visits           7 months ago Epigastric pain   [place], [place]   10 months ago Cough   [facility] Birdie Sons, MD   1 year ago URI, acute   [place], [place]   1 year ago Sinusitis, unspecified chronicity, unspecified location   [facility] Birdie Sons, MD   1 year ago Sinusitis, unspecified chronicity, unspecified location   [facility] Birdie Sons, MD

## 2019-11-28 NOTE — Telephone Encounter (Signed)
Medication Refill - Medication: methylphenidate (CONCERTA) 54 MG PO CR tablet    Has the patient contacted their pharmacy? Yes.   (Agent: If no, request that the patient contact the pharmacy for the refill.) (Agent: If yes, when and what did the pharmacy advise?)  Preferred Pharmacy (with phone number or street name): [facility] [address]   Agent: Please be advised that RX refills may take up to 3 business days. We ask that you follow-up with your pharmacy.

## 2019-12-29 ENCOUNTER — Other Ambulatory Visit: Payer: Self-pay

## 2019-12-29 MED ORDER — METHYLPHENIDATE HCL ER (OSM) 54 MG PO TBCR: 54.0000 mg | EXTENDED_RELEASE_TABLET | ORAL | 0 refills | Status: DC

## 2019-12-29 NOTE — Telephone Encounter (Signed)
Pt called in and stated he will be completely ou of this med after today.   Best [phone] Pharmacy - [facility] [address] [place]

## 2020-01-25 ENCOUNTER — Other Ambulatory Visit: Payer: Self-pay

## 2020-01-25 MED ORDER — METHYLPHENIDATE HCL ER (OSM) 54 MG PO TBCR: 54.0000 mg | EXTENDED_RELEASE_TABLET | ORAL | 0 refills | Status: DC

## 2020-02-22 ENCOUNTER — Other Ambulatory Visit: Payer: Self-pay

## 2020-02-22 MED ORDER — METHYLPHENIDATE HCL ER (OSM) 54 MG PO TBCR: 54.0000 mg | EXTENDED_RELEASE_TABLET | ORAL | 0 refills | Status: DC

## 2020-02-22 NOTE — Telephone Encounter (Signed)
Last RF 01/25/2020

## 2020-03-26 ENCOUNTER — Other Ambulatory Visit: Payer: Self-pay

## 2020-03-26 DIAGNOSIS — F902 Attention-deficit hyperactivity disorder, combined type: Secondary | ICD-10-CM

## 2020-03-26 MED ORDER — METHYLPHENIDATE HCL ER (OSM) 54 MG PO TBCR: 54.0000 mg | EXTENDED_RELEASE_TABLET | ORAL | 0 refills | Status: DC

## 2020-03-26 NOTE — Telephone Encounter (Signed)
Patient's mother sent a request through Mychart.  I have let her know that I sent the message to you and also let her know that he is due for an appointment

## 2020-03-27 ENCOUNTER — Telehealth: Payer: Self-pay

## 2020-03-27 ENCOUNTER — Other Ambulatory Visit: Payer: Self-pay | Admitting: Family Medicine

## 2020-03-27 NOTE — Telephone Encounter (Signed)
Copied from CRM [id number]. Topic: Appointment Scheduling - Scheduling Inquiry for Clinic >> Mar 27, 2020  9:04 AM Deborha Payment wrote: Reason for CRM: Patient needs a rx refill appt.  Patient is requesting a virtual appt for next month to fill prescription.  methylphenidate (CONCERTA) 54 MG PO CR tablet  Call back [phone]

## 2020-03-27 NOTE — Telephone Encounter (Signed)
Appt scheduled

## 2020-03-27 NOTE — Telephone Encounter (Signed)
Need to see him in office, this medication requires that we follow weight, bp, and heart rate in addition to periodic cardiac exam. Ok to come in closer to summer like June but no later than the end of July.

## 2020-03-27 NOTE — Telephone Encounter (Signed)
Patient is over due for follow up appointment. Patient would like to schedule a virtual visit for follow up of ADHD. Please advise if ok to do virtual visit, or whether he needs to be seen in the office. His last office visit with you was for an acute visit on 01/07/2019.  Last ADHD follow up was 10/21/2017.

## 2020-04-25 ENCOUNTER — Other Ambulatory Visit: Payer: Self-pay | Admitting: Family Medicine

## 2020-04-25 DIAGNOSIS — F902 Attention-deficit hyperactivity disorder, combined type: Secondary | ICD-10-CM

## 2020-04-25 MED ORDER — METHYLPHENIDATE HCL ER (OSM) 54 MG PO TBCR: 54.0000 mg | EXTENDED_RELEASE_TABLET | ORAL | 0 refills | Status: DC

## 2020-04-27 NOTE — Progress Notes (Signed)
Established patient visit   Patient: Joshua Green   DOB: Jun 01, 2001   19 y.o. Male  MRN: [id number] Visit Date: 05/01/2020  Today's healthcare provider: Mila Merry, MD   Chief Complaint  Patient presents with  . ADHD   Subjective    HPI Follow up for ADD:  The patient was last seen for this 08/16/2018.  Changes made at last visit include reducing Concerta to 36mg  daily. Pt has been taking 54mg  a day.   He reports excellent compliance with treatment. He feels that condition is stable. He is not having side effects.  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  ----------------------------------------------------------------------------------------- He also reports he has stopped all other medications since his mood has been good and rarely requires albuterol inhalers. Has mild allergy symptoms but prescription medications didn't make much difference.      Medications: Outpatient Medications Prior to Visit  Medication Sig  . albuterol (PROAIR HFA) 108 (90 Base) MCG/ACT inhaler before exercise, and as needed every 6 hours  . beclomethasone (QVAR REDIHALER) 40 MCG/ACT inhaler Inhale 2 puffs into the lungs daily.  . bisacodyl (DULCOLAX) 5 MG EC tablet Take by mouth.  . methylphenidate (CONCERTA) 54 MG PO CR tablet Take 1 tablet (54 mg total) by mouth every morning.  . cetirizine (ZYRTEC) 10 MG tablet Take 1 tablet (10 mg total) by mouth daily.  . fluticasone (FLONASE ALLERGY RELIEF) 50 MCG/ACT nasal spray Place 2 sprays into both nostrils daily.  . fluticasone (FLOVENT HFA) 110 MCG/ACT inhaler Inhale 2 puffs into the lungs daily.  . hyoscyamine (LEVSIN SL) 0.125 MG SL tablet Place 1 tablet (0.125 mg total) under the tongue every 4 (four) hours as needed.  ibuprofen (ADVIL,MOTRIN) 600 MG tablet Take 600 mg by mouth every 6 (six) hours as needed.  . montelukast (SINGULAIR) 10 MG  tablet TAKE 1 TABLET BY MOUTH EVERY DAY  . omeprazole (PRILOSEC) 40 MG capsule Take 1 capsule (40 mg total) by mouth daily.  . potassium citrate (UROCIT-K) 10 MEQ (1080 MG) SR tablet TAKE 1 TABLET TWICE DAILY  . sertraline (ZOLOFT) 25 MG tablet TAKE 1 TABLET BY MOUTH EVERY DAY   No facility-administered medications prior to visit.    Review of Systems  Constitutional: Negative.  Negative for appetite change, chills and fever.  Respiratory: Negative.  Negative for chest tightness, shortness of breath and wheezing.   Cardiovascular: Negative.  Negative for chest pain and palpitations.  Gastrointestinal: Negative.  Negative for abdominal pain, nausea and vomiting.  Neurological: Negative for dizziness, light-headedness and headaches.  Psychiatric/Behavioral: Negative for decreased concentration, dysphoric mood, self-injury, sleep disturbance and suicidal ideas. The patient is not nervous/anxious.       Objective    BP 104/68 (BP Location: Right Arm, Patient Position: Sitting, Cuff Size: Large)   Pulse 80   Temp 97.7 F (36.5 C) (Temporal)   Wt 161 lb (73 kg)   SpO2 98%   BMI 27.64 kg/m    Physical Exam   General: Appearance:     Overweight male in no acute distress  Eyes:    PERRL, conjunctiva/corneas clear, EOM's intact       Lungs:     Clear to auscultation bilaterally, respirations unlabored  Heart:    Normal heart rate. Normal rhythm. No murmurs, rubs, or gallops.   MS:   All extremities are intact.   Neurologic:   Awake, alert,  oriented x 3. No apparent focal neurological           defect.         No results found for any visits on 05/01/20.  Assessment & Plan     1. 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   2. Uncomplicated asthma, unspecified asthma severity, unspecified whether persistent Rarely uses albuterol having been off of maintenance  inhaler for several months.   3. Allergic rhinitis due to pollen, unspecified seasonality No longer requiring maintenance medications. Can continue OTC medications prn only.   4. Anxiety Doing very well, now off of sertraline.   5. Hypocitraturia Reports not symptoms of kidney for may years and has stopped potassium citrate.   - albuterol (PROAIR HFA) 108 (90 Base) MCG/ACT inhaler; before exercise, and as needed every 6 hours  Dispense: 18 g; Refill: 2   No follow-ups on file.      The entirety of the information documented in the History of Present Illness, Review of Systems and Physical Exam were personally obtained by me. Portions of this information were initially documented by the CMA and reviewed by me for thoroughness and accuracy.      [name], MD  [facility] [phone] (phone) [phone] (fax)  [place]

## 2020-05-01 ENCOUNTER — Other Ambulatory Visit: Payer: Self-pay

## 2020-05-01 ENCOUNTER — Ambulatory Visit (INDEPENDENT_AMBULATORY_CARE_PROVIDER_SITE_OTHER): Payer: Medicaid Other | Admitting: Family Medicine

## 2020-05-01 ENCOUNTER — Encounter: Payer: Self-pay | Admitting: Family Medicine

## 2020-05-01 VITALS — BP 104/68 | HR 80 | Temp 97.7°F | Wt 161.0 lb

## 2020-05-01 DIAGNOSIS — F419 Anxiety disorder, unspecified: Secondary | ICD-10-CM

## 2020-05-01 DIAGNOSIS — J301 Allergic rhinitis due to pollen: Secondary | ICD-10-CM

## 2020-05-01 DIAGNOSIS — R82991 Hypocitraturia: Secondary | ICD-10-CM | POA: Diagnosis not present

## 2020-05-01 DIAGNOSIS — F902 Attention-deficit hyperactivity disorder, combined type: Secondary | ICD-10-CM | POA: Diagnosis not present

## 2020-05-01 DIAGNOSIS — J45909 Unspecified asthma, uncomplicated: Secondary | ICD-10-CM

## 2020-05-01 MED ORDER — ALBUTEROL SULFATE HFA 108 (90 BASE) MCG/ACT IN AERS: INHALATION_SPRAY | RESPIRATORY_TRACT | 2 refills | Status: DC

## 2020-05-22 ENCOUNTER — Other Ambulatory Visit: Payer: Self-pay

## 2020-05-22 DIAGNOSIS — F902 Attention-deficit hyperactivity disorder, combined type: Secondary | ICD-10-CM

## 2020-05-22 MED ORDER — METHYLPHENIDATE HCL ER (OSM) 54 MG PO TBCR: 54.0000 mg | EXTENDED_RELEASE_TABLET | ORAL | 0 refills | Status: DC

## 2020-06-25 ENCOUNTER — Other Ambulatory Visit: Payer: Self-pay

## 2020-06-25 DIAGNOSIS — F902 Attention-deficit hyperactivity disorder, combined type: Secondary | ICD-10-CM

## 2020-06-25 MED ORDER — METHYLPHENIDATE HCL ER (OSM) 54 MG PO TBCR: 54.0000 mg | EXTENDED_RELEASE_TABLET | ORAL | 0 refills | Status: DC

## 2020-06-25 NOTE — Telephone Encounter (Signed)
Patient's mom sent a mychart message requesting a refill. He only has one pill left.

## 2020-06-26 ENCOUNTER — Other Ambulatory Visit: Payer: Self-pay | Admitting: Family Medicine

## 2020-07-23 ENCOUNTER — Other Ambulatory Visit: Payer: Self-pay

## 2020-07-23 DIAGNOSIS — F902 Attention-deficit hyperactivity disorder, combined type: Secondary | ICD-10-CM

## 2020-07-23 MED ORDER — METHYLPHENIDATE HCL ER (OSM) 54 MG PO TBCR: 54.0000 mg | EXTENDED_RELEASE_TABLET | ORAL | 0 refills | Status: DC

## 2020-07-23 NOTE — Telephone Encounter (Signed)
Patient's mom Dawn sent a mychart message through her chart requesting a refill.

## 2020-07-25 ENCOUNTER — Encounter (INDEPENDENT_AMBULATORY_CARE_PROVIDER_SITE_OTHER): Payer: Self-pay

## 2020-07-30 ENCOUNTER — Other Ambulatory Visit: Payer: Self-pay

## 2020-07-30 ENCOUNTER — Emergency Department
Admission: EM | Admit: 2020-07-30 | Discharge: 2020-07-30 | Disposition: A | Discharge status: Home / Self Care | Payer: Medicaid Other | Attending: Emergency Medicine | Admitting: Emergency Medicine

## 2020-07-30 ENCOUNTER — Encounter: Payer: Self-pay | Admitting: *Deleted

## 2020-07-30 DIAGNOSIS — Z79899 Other long term (current) drug therapy: Secondary | ICD-10-CM | POA: Diagnosis not present

## 2020-07-30 DIAGNOSIS — F909 Attention-deficit hyperactivity disorder, unspecified type: Secondary | ICD-10-CM | POA: Diagnosis not present

## 2020-07-30 DIAGNOSIS — R519 Headache, unspecified: Secondary | ICD-10-CM | POA: Diagnosis present

## 2020-07-30 DIAGNOSIS — J029 Acute pharyngitis, unspecified: Secondary | ICD-10-CM | POA: Diagnosis not present

## 2020-07-30 DIAGNOSIS — R059 Cough, unspecified: Secondary | ICD-10-CM

## 2020-07-30 DIAGNOSIS — J45909 Unspecified asthma, uncomplicated: Secondary | ICD-10-CM | POA: Insufficient documentation

## 2020-07-30 DIAGNOSIS — R05 Cough: Secondary | ICD-10-CM | POA: Diagnosis not present

## 2020-07-30 DIAGNOSIS — U071 COVID-19: Secondary | ICD-10-CM | POA: Insufficient documentation

## 2020-07-30 DIAGNOSIS — Z20822 Contact with and (suspected) exposure to covid-19: Secondary | ICD-10-CM | POA: Diagnosis not present

## 2020-07-30 LAB — GROUP A STREP BY PCR: Group A Strep by PCR: NOT DETECTED

## 2020-07-30 MED ORDER — ALBUTEROL SULFATE HFA 108 (90 BASE) MCG/ACT IN AERS: 2.0000 | INHALATION_SPRAY | Freq: Four times a day (QID) | RESPIRATORY_TRACT | 2 refills | Status: DC | PRN

## 2020-07-30 MED ORDER — BENZONATATE 100 MG PO CAPS: 100.0000 mg | ORAL_CAPSULE | Freq: Three times a day (TID) | ORAL | 0 refills | Status: AC | PRN

## 2020-07-30 NOTE — ED Provider Notes (Signed)
Emergency Department Provider Note  ____________________________________________  Time seen: Approximately 11:40 PM  I have reviewed the triage vital signs and the nursing notes.   HISTORY  Chief Complaint Sore Throat   Historian Patient    HPI Joshua Green is a 19 y.o. male presents to the emergency department with sporadic cough, pharyngitis, headache and body aches.  He is also had low-grade fever at home.  No known sick contacts.  No recent travel.  He denies chest pain, chest tightness and abdominal pain.   Past Medical History:  Diagnosis Date  . ADHD (attention deficit hyperactivity disorder)   . Anxiety   . Asthma   . Kidney stone    Following at [facility]  . Premature baby    born at 55 weeks, NICU for 57 days     Immunizations up to date:  Yes.     Past Medical History:  Diagnosis Date  . ADHD (attention deficit hyperactivity disorder)   . Anxiety   . Asthma   . Kidney stone    Following at [facility]  . Premature baby    born at 41 weeks, NICU for 57 days    Patient Active Problem List   Diagnosis Date Noted  . Chronic constipation 02/29/2016  . Allergic rhinitis 08/14/2015  . Anxiety 08/14/2015  . Asthma 08/14/2015  . History of kidney stones 08/14/2015  . Insomnia 08/14/2015  . Pelvic floor dysfunction 07/01/2013  . 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 Recurrent nephrolithiasis 07/01/2012  . ENURESIS 04/10/2010  . ADHD (attention deficit hyperactivity disorder) 04/10/2010    Past Surgical History:  Procedure Laterality Date  . LITHOTRIPSY    . OTHER SURGICAL HISTORY     blood transfusion as an infant  . TONSILLECTOMY     Dr. Jenne Campus  . TYMPANOSTOMY TUBE PLACEMENT     x3    Prior to Admission medications   Medication Sig Start Date End Date Taking? Authorizing Provider  albuterol (VENTOLIN HFA) 108 (90 Base) MCG/ACT inhaler Inhale 2 puffs into the lungs every 6 (six) hours as needed for wheezing or shortness of breath. 07/30/20   Orvil Feil,  PA-C  benzonatate (TESSALON PERLES) 100 MG capsule Take 1 capsule (100 mg total) by mouth 3 (three) times daily as needed for up to 7 days for cough. 07/30/20 08/06/20  Orvil Feil, PA-C  ibuprofen (ADVIL,MOTRIN) 600 MG tablet Take 600 mg by mouth every 6 (six) hours as needed.    [provider]  methylphenidate (CONCERTA) 54 MG PO CR tablet Take 1 tablet (54 mg total) by mouth every morning. 07/23/20   Malva Limes, MD    Allergies Amoxicillin-pot clavulanate and Ceftin  [cefuroxime axetil]  Family History  Problem Relation Age of Onset  . Diabetes Mother   . Stroke Mother   . Coronary artery disease Mother   . Colon polyps Mother   . Bipolar disorder Mother   . Kidney disease Mother   . Diabetes Maternal Grandfather   . Stroke Maternal Grandfather   . Hypertension Maternal Grandfather   . Colon cancer Maternal Grandfather   . Coronary artery disease Maternal Grandfather   . Heart disease Maternal Grandfather   . Heart attack Maternal Grandfather   . Depression Maternal Grandmother   . Bipolar disorder Maternal Grandmother     Social History Social History   Tobacco Use  . Smoking status: Never Smoker  . Smokeless tobacco: Never Used  Substance Use Topics  . Alcohol use: No  .  Drug use: No      Review of Systems  Constitutional: Patient has fever.  Eyes: No visual changes. No discharge ENT: Patient has congestion.  Cardiovascular: no chest pain. Respiratory: Patient has cough.  Gastrointestinal: No abdominal pain.  No nausea, no vomiting. Patient had diarrhea.  Genitourinary: Negative for dysuria. No hematuria Musculoskeletal: Patient has myalgias.  Skin: Negative for rash, abrasions, lacerations, ecchymosis. Neurological: Patient has headache, no focal weakness or numbness.     ____________________________________________   PHYSICAL EXAM:  VITAL SIGNS: ED Triage Vitals  Enc Vitals Group     BP 07/30/20 2202 107/79     Pulse Rate  07/30/20 2202 94     Resp 07/30/20 2202 20     Temp 07/30/20 2202 100 F (37.8 C)     Temp Source 07/30/20 2202 Oral     SpO2 07/30/20 2202 100 %     Weight 07/30/20 2202 162 lb 11.2 oz (73.8 kg)     Height --      Head Circumference --      Peak Flow --      Pain Score 07/30/20 2334 0     Pain Loc --      Pain Edu? --      Excl. in GC? --      Constitutional: Alert and oriented. Patient is lying supine. Eyes: Conjunctivae are normal. PERRL. EOMI. Head: Atraumatic. ENT:      Ears: Tympanic membranes are mildly injected with mild effusion bilaterally.       Nose: No congestion/rhinnorhea.      Mouth/Throat: Mucous membranes are moist. Posterior pharynx is mildly erythematous.  Hematological/Lymphatic/Immunilogical: No cervical lymphadenopathy.  Cardiovascular: Normal rate, regular rhythm. Normal S1 and S2.  Good peripheral circulation. Respiratory: Normal respiratory effort without tachypnea or retractions. Lungs CTAB. Good air entry to the bases with no decreased or absent breath sounds. Gastrointestinal: Bowel sounds 4 quadrants. Soft and nontender to palpation. No guarding or rigidity. No palpable masses. No distention. No CVA tenderness. Musculoskeletal: Full range of motion to all extremities. No gross deformities appreciated. Neurologic:  Normal speech and language. No gross focal neurologic deficits are appreciated.  Skin:  Skin is warm, dry and intact. No rash noted. Psychiatric: Mood and affect are normal. Speech and behavior are normal. Patient exhibits appropriate insight and judgement.    ____________________________________________   LABS (all labs ordered are listed, but only abnormal results are displayed)  Labs Reviewed  GROUP A STREP BY PCR  SARS CORONAVIRUS 2 BY RT PCR (HOSPITAL ORDER, PERFORMED IN  HOSPITAL LAB)   ____________________________________________  EKG   ____________________________________________  RADIOLOGY  No results  found.  ____________________________________________    PROCEDURES  Procedure(s) performed:     Procedures     Medications - No data to display   ____________________________________________   INITIAL IMPRESSION / ASSESSMENT AND PLAN / ED COURSE  Pertinent labs & imaging results that were available during my care of the patient were reviewed by me and considered in my medical decision making (see chart for details).      Assessment and plan Fever Cough Pharyngitis 19 year old male presents to the emergency department with fever, body aches and pharyngitis.  Vital signs were reassuring at triage.  On physical exam, patient was resting comfortably with no increased work of breathing.  No adventitious lung sounds were auscultated.  Differential diagnosis includes group A strep pharyngitis, COVID-19, unspecified viral URI  Group A strep and COVID-19 testing are in process at this  time.  Patient wished to be discharged from the emergency department and notified of results.  I informed patient that I would contact him by phone tomorrow, July 31, 2020 with results.  Return precautions were given to return with new or worsening symptoms.  All patient questions were answered.    ____________________________________________  FINAL CLINICAL IMPRESSION(S) / ED DIAGNOSES  Final diagnoses:  Pharyngitis, unspecified etiology  Cough      NEW MEDICATIONS STARTED DURING THIS VISIT:  ED Discharge Orders         Ordered    albuterol (VENTOLIN HFA) 108 (90 Base) MCG/ACT inhaler  Every 6 hours PRN        07/30/20 2321    benzonatate (TESSALON PERLES) 100 MG capsule  3 times daily PRN        07/30/20 2321              This chart was dictated using voice recognition software/Dragon. Despite best efforts to proofread, errors can occur which can change the meaning. Any change was purely unintentional.     Orvil Feil, PA-C 07/30/20 Marchelle Folks,  MD 07/31/20 [phone]

## 2020-07-30 NOTE — ED Triage Notes (Signed)
Pt states sore throat, cough, runny nose and headache for 4 days.  Pt alert  Speech clear.

## 2020-07-30 NOTE — ED Notes (Signed)
Pt states that he hasn't been feeling good and that he's had a cough, sore throat, body aches, and other flu like symptoms. Patient ambulated with this Rn to room with no difficulty. Denies cp or shob.

## 2020-07-31 ENCOUNTER — Encounter: Payer: Self-pay | Admitting: Family Medicine

## 2020-07-31 DIAGNOSIS — U071 COVID-19: Secondary | ICD-10-CM | POA: Insufficient documentation

## 2020-07-31 HISTORY — DX: COVID-19: U07.1

## 2020-07-31 LAB — SARS CORONAVIRUS 2 BY RT PCR (HOSPITAL ORDER, PERFORMED IN ~~LOC~~ HOSPITAL LAB): SARS Coronavirus 2: POSITIVE — AB

## 2020-08-01 ENCOUNTER — Telehealth: Payer: Self-pay | Admitting: Emergency Medicine

## 2020-08-01 ENCOUNTER — Other Ambulatory Visit: Payer: Self-pay

## 2020-08-01 NOTE — Telephone Encounter (Signed)
Called patient to assure he is aware of covid result positive.  Left message.

## 2020-08-02 ENCOUNTER — Telehealth: Payer: Self-pay

## 2020-08-02 ENCOUNTER — Encounter: Payer: Self-pay | Admitting: Family Medicine

## 2020-08-02 NOTE — Telephone Encounter (Signed)
LMTCB 08/02/2020.  PEC please find out when pt's symptoms started.   Thanks,   -Vernona Rieger

## 2020-08-02 NOTE — Telephone Encounter (Signed)
He may qualify for his asthma.  Will need to know day of symptoms starting and then can send Epic chat message to [facility] and they will reach out if he qualifies

## 2020-08-02 NOTE — Telephone Encounter (Signed)
The patient's mother sent this message through her Earleen Reaper account (MRN [id number]) about her son.  The patient tested positive on 07/30/20 in the ED.  Please review and advise regarding referral for infusion  Hey Dr Sherrie Mustache there are asking me if I've made a follow up with you so Joshua Green can get the Monoclonal antibody treatment I didn't know anything about that is that something he needs  Thanks

## 2020-08-21 ENCOUNTER — Encounter: Payer: Self-pay | Admitting: Family Medicine

## 2020-08-21 DIAGNOSIS — F902 Attention-deficit hyperactivity disorder, combined type: Secondary | ICD-10-CM

## 2020-08-22 MED ORDER — METHYLPHENIDATE HCL ER (OSM) 54 MG PO TBCR: 54.0000 mg | EXTENDED_RELEASE_TABLET | ORAL | 0 refills | Status: DC

## 2020-09-19 ENCOUNTER — Other Ambulatory Visit: Payer: Self-pay | Admitting: Family Medicine

## 2020-09-19 DIAGNOSIS — F902 Attention-deficit hyperactivity disorder, combined type: Secondary | ICD-10-CM

## 2020-09-20 MED ORDER — METHYLPHENIDATE HCL ER (OSM) 54 MG PO TBCR: 54.0000 mg | EXTENDED_RELEASE_TABLET | ORAL | 0 refills | Status: DC

## 2020-10-20 ENCOUNTER — Other Ambulatory Visit: Payer: Self-pay | Admitting: Family Medicine

## 2020-10-20 DIAGNOSIS — F902 Attention-deficit hyperactivity disorder, combined type: Secondary | ICD-10-CM

## 2020-10-22 ENCOUNTER — Other Ambulatory Visit: Payer: Self-pay | Admitting: Family Medicine

## 2020-10-22 DIAGNOSIS — F902 Attention-deficit hyperactivity disorder, combined type: Secondary | ICD-10-CM

## 2020-10-22 MED ORDER — METHYLPHENIDATE HCL ER (OSM) 54 MG PO TBCR: 54.0000 mg | EXTENDED_RELEASE_TABLET | ORAL | 0 refills | Status: DC

## 2020-10-28 ENCOUNTER — Other Ambulatory Visit: Payer: Self-pay

## 2020-10-28 ENCOUNTER — Emergency Department
Admission: EM | Admit: 2020-10-28 | Discharge: 2020-10-28 | Disposition: A | Discharge status: Home / Self Care | Payer: BLUE CROSS/BLUE SHIELD | Attending: Emergency Medicine | Admitting: Emergency Medicine

## 2020-10-28 DIAGNOSIS — Z8616 Personal history of COVID-19: Secondary | ICD-10-CM | POA: Diagnosis not present

## 2020-10-28 DIAGNOSIS — X500XXA Overexertion from strenuous movement or load, initial encounter: Secondary | ICD-10-CM | POA: Insufficient documentation

## 2020-10-28 DIAGNOSIS — S76912A Strain of unspecified muscles, fascia and tendons at thigh level, left thigh, initial encounter: Secondary | ICD-10-CM | POA: Diagnosis not present

## 2020-10-28 DIAGNOSIS — J45909 Unspecified asthma, uncomplicated: Secondary | ICD-10-CM | POA: Diagnosis not present

## 2020-10-28 DIAGNOSIS — S76902A Unspecified injury of unspecified muscles, fascia and tendons at thigh level, left thigh, initial encounter: Secondary | ICD-10-CM | POA: Diagnosis not present

## 2020-10-28 DIAGNOSIS — Y9362 Activity, american flag or touch football: Secondary | ICD-10-CM | POA: Diagnosis not present

## 2020-10-28 DIAGNOSIS — S76212A Strain of adductor muscle, fascia and tendon of left thigh, initial encounter: Secondary | ICD-10-CM | POA: Diagnosis not present

## 2020-10-28 MED ORDER — IBUPROFEN 600 MG PO TABS: 600.0000 mg | ORAL_TABLET | Freq: Four times a day (QID) | ORAL | 0 refills | Status: DC | PRN

## 2020-10-28 MED ORDER — BACLOFEN 5 MG PO TABS: 5.0000 mg | ORAL_TABLET | Freq: Three times a day (TID) | ORAL | 0 refills | Status: DC | PRN

## 2020-10-28 NOTE — ED Provider Notes (Signed)
[facility] Emergency Department Provider Note  ____________________________________________  Time seen: Approximately 5:14 PM  I have reviewed the triage vital signs and the nursing notes.   HISTORY  Chief Complaint Leg Pain    HPI Joshua Green is a 19 y.o. male that presents to the emergency department for evaluation of left groin pain after injury today.  Patient was playing flag football with his church today when he thinks he may have pulled his groin.  He did not have any specific incident but the inside of his left leg became sore while he was playing.  He continued to play.  Area became more painful at following the game.  Pain is worse with walking.  He did not have a fall or have any specific trauma.  He came to the emergency department to be evaluated for a pulled muscle.  He has not taken any medication for pain.   Past Medical History:  Diagnosis Date  . ADHD (attention deficit hyperactivity disorder)   . Anxiety   . Asthma   . Kidney stone    Following at [facility]  . Premature baby    born at 71 weeks, NICU for 57 days    Patient Active Problem List   Diagnosis Date Noted  . COVID-19 07/31/2020  . Chronic constipation 02/29/2016  . Allergic rhinitis 08/14/2015  . Anxiety 08/14/2015  . Asthma 08/14/2015  . History of kidney stones 08/14/2015  . Insomnia 08/14/2015  . Pelvic floor dysfunction 07/01/2013  . 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 Recurrent nephrolithiasis 07/01/2012  . ENURESIS 04/10/2010  . ADHD (attention deficit hyperactivity disorder) 04/10/2010    Past Surgical History:  Procedure Laterality Date  . LITHOTRIPSY    . OTHER SURGICAL HISTORY     blood transfusion as an infant  . TONSILLECTOMY     Dr. Jenne Campus  . TYMPANOSTOMY TUBE PLACEMENT     x3    Prior to Admission medications   Medication Sig Start Date End Date Taking? Authorizing Provider  albuterol (VENTOLIN HFA) 108 (90 Base) MCG/ACT inhaler Inhale 2 puffs into  the lungs every 6 (six) hours as needed for wheezing or shortness of breath. 07/30/20   Orvil Feil, PA-C  Baclofen 5 MG TABS Take 5 mg by mouth 3 (three) times daily as needed. 10/28/20   Enid Derry, PA-C  ibuprofen (ADVIL) 600 MG tablet Take 1 tablet (600 mg total) by mouth every 6 (six) hours as needed. 10/28/20   Enid Derry, PA-C  methylphenidate (CONCERTA) 54 MG PO CR tablet Take 1 tablet (54 mg total) by mouth every morning. 10/22/20   Malva Limes, MD    Allergies Amoxicillin-pot clavulanate and Ceftin  [cefuroxime axetil]  Family History  Problem Relation Age of Onset  . Diabetes Mother   . Stroke Mother   . Coronary artery disease Mother   . Colon polyps Mother   . Bipolar disorder Mother   . Kidney disease Mother   . Diabetes Maternal Grandfather   . Stroke Maternal Grandfather   . Hypertension Maternal Grandfather   . Colon cancer Maternal Grandfather   . Coronary artery disease Maternal Grandfather   . Heart disease Maternal Grandfather   . Heart attack Maternal Grandfather   . Depression Maternal Grandmother   . Bipolar disorder Maternal Grandmother     Social History Social History   Tobacco Use  . Smoking status: Never Smoker  . Smokeless tobacco: Never Used  Substance Use Topics  . Alcohol use: No  .  Drug use: No     Review of Systems  Cardiovascular: No chest pain. Respiratory: No SOB. Gastrointestinal: No abdominal pain.  No nausea, no vomiting.  Musculoskeletal: Positive for leg and groin pain. Skin: Negative for rash, abrasions, lacerations, ecchymosis. Neurological: Negative for headaches, numbness or tingling   ____________________________________________   PHYSICAL EXAM:  VITAL SIGNS: ED Triage Vitals [10/28/20 1626]  Enc Vitals Group     BP 136/81     Pulse Rate (!) 105     Resp 18     Temp 99.5 F (37.5 C)     Temp Source Oral     SpO2 98 %     Weight 160 lb (72.6 kg)     Height 5\' 4"  (1.626 m)     Head  Circumference      Peak Flow      Pain Score 0     Pain Loc      Pain Edu?      Excl. in GC?      Constitutional: Alert and oriented. Well appearing and in no acute distress. Eyes: Conjunctivae are normal. PERRL. EOMI. Head: Atraumatic. ENT:      Ears:      Nose: No congestion/rhinnorhea.      Mouth/Throat: Mucous membranes are moist.  Neck: No stridor.  Cardiovascular: Normal rate, regular rhythm.  Good peripheral circulation. Respiratory: Normal respiratory effort without tachypnea or retractions. Lungs CTAB. Good air entry to the bases with no decreased or absent breath sounds. Musculoskeletal: Full range of motion to all extremities. No gross deformities appreciated.  Tenderness to palpation to left groin.  Full range of motion of left hip and left knee.  Antalgic gait. Neurologic:  Normal speech and language. No gross focal neurologic deficits are appreciated.  Skin:  Skin is warm, dry and intact. No rash noted. Psychiatric: Mood and affect are normal. Speech and behavior are normal. Patient exhibits appropriate insight and judgement.   ____________________________________________   LABS (all labs ordered are listed, but only abnormal results are displayed)  Labs Reviewed - No data to display ____________________________________________  EKG   ____________________________________________  RADIOLOGY  No results found.  ____________________________________________    PROCEDURES  Procedure(s) performed:    Procedures    Medications - No data to display   ____________________________________________   INITIAL IMPRESSION / ASSESSMENT AND PLAN / ED COURSE  Pertinent labs & imaging results that were available during my care of the patient were reviewed by me and considered in my medical decision making (see chart for details).  Review of the [place] CSRS was performed in accordance of the [facility] prior to dispensing any controlled drugs.   Patient's diagnosis  is consistent with muscle strain. Patient will be discharged home with prescriptions for baclofen and Motrin.  Patient is to follow up with primary care as directed. Patient is given ED precautions to return to the ED for any worsening or new symptoms.   Joshua Green was evaluated in Emergency Department on 10/28/2020 for the symptoms described in the history of present illness. He was evaluated in the context of the global COVID-19 pandemic, which necessitated consideration that the patient might be at risk for infection with the SARS-CoV-2 virus that causes COVID-19. Institutional protocols and algorithms that pertain to the evaluation of patients at risk for COVID-19 are in a state of rapid change based on information released by regulatory bodies including the CDC and federal and state organizations. These policies and algorithms were followed during the  patient's care in the ED.  ____________________________________________  FINAL CLINICAL IMPRESSION(S) / ED DIAGNOSES  Final diagnoses:  Inguinal strain, left, initial encounter      NEW MEDICATIONS STARTED DURING THIS VISIT:  ED Discharge Orders         Ordered    ibuprofen (ADVIL) 600 MG tablet  Every 6 hours PRN        10/28/20 1715    Baclofen 5 MG TABS  3 times daily PRN        10/28/20 1715              This chart was dictated using voice recognition software/Dragon. Despite best efforts to proofread, errors can occur which can change the meaning. Any change was purely unintentional.    Enid Derry, PA-C 10/28/20 1819    Delton Prairie, MD 10/28/20 2329

## 2020-10-28 NOTE — ED Triage Notes (Signed)
Pt was playing football and thinks he pulled a muscle in his left upper thigh.

## 2020-10-29 ENCOUNTER — Telehealth: Payer: Self-pay

## 2020-10-29 NOTE — Telephone Encounter (Signed)
Transition Care Management Unsuccessful Follow-up Telephone Call  Date of discharge and from where:  10/28/2020 [facility] ED  Attempts:  1st Attempt  Reason for unsuccessful TCM follow-up call:  Left voice message

## 2020-10-30 NOTE — Telephone Encounter (Signed)
Transition Care Management Unsuccessful Follow-up Telephone Call  Date of discharge and from where:  10/28/2020 [facility] ED  Attempts:  2nd Attempt  Reason for unsuccessful TCM follow-up call:  Left voice message

## 2020-10-31 NOTE — Telephone Encounter (Signed)
Transition Care Management Unsuccessful Follow-up Telephone Call  Date of discharge and from where:  10/28/2020 [facility] ED  Attempts:  3rd Attempt  Reason for unsuccessful TCM follow-up call:  Left voice message

## 2020-11-19 ENCOUNTER — Other Ambulatory Visit: Payer: Self-pay | Admitting: Family Medicine

## 2020-11-19 DIAGNOSIS — F902 Attention-deficit hyperactivity disorder, combined type: Secondary | ICD-10-CM

## 2020-11-19 MED ORDER — METHYLPHENIDATE HCL ER (OSM) 54 MG PO TBCR: 54.0000 mg | EXTENDED_RELEASE_TABLET | ORAL | 0 refills | Status: DC

## 2020-12-18 ENCOUNTER — Other Ambulatory Visit: Payer: Self-pay | Admitting: Family Medicine

## 2020-12-18 DIAGNOSIS — F902 Attention-deficit hyperactivity disorder, combined type: Secondary | ICD-10-CM

## 2020-12-19 MED ORDER — METHYLPHENIDATE HCL ER (OSM) 54 MG PO TBCR: 54.0000 mg | EXTENDED_RELEASE_TABLET | ORAL | 0 refills | Status: DC

## 2020-12-20 DIAGNOSIS — Z20822 Contact with and (suspected) exposure to covid-19: Secondary | ICD-10-CM | POA: Diagnosis not present

## 2020-12-20 DIAGNOSIS — J069 Acute upper respiratory infection, unspecified: Secondary | ICD-10-CM | POA: Diagnosis not present

## 2021-01-14 IMAGING — CR DG CHEST 2V
1 series · 2 of 2 positions shown · non-contrast
Comparison: 08/08/2017

CLINICAL DATA: Nonproductive cough for 2 months.  Asthma.

EXAM:
CHEST - 2 VIEW

[Series 1: dg chest 2 view · 0.14mm/px · 2 of 2 slices shown]
[im 1/2]
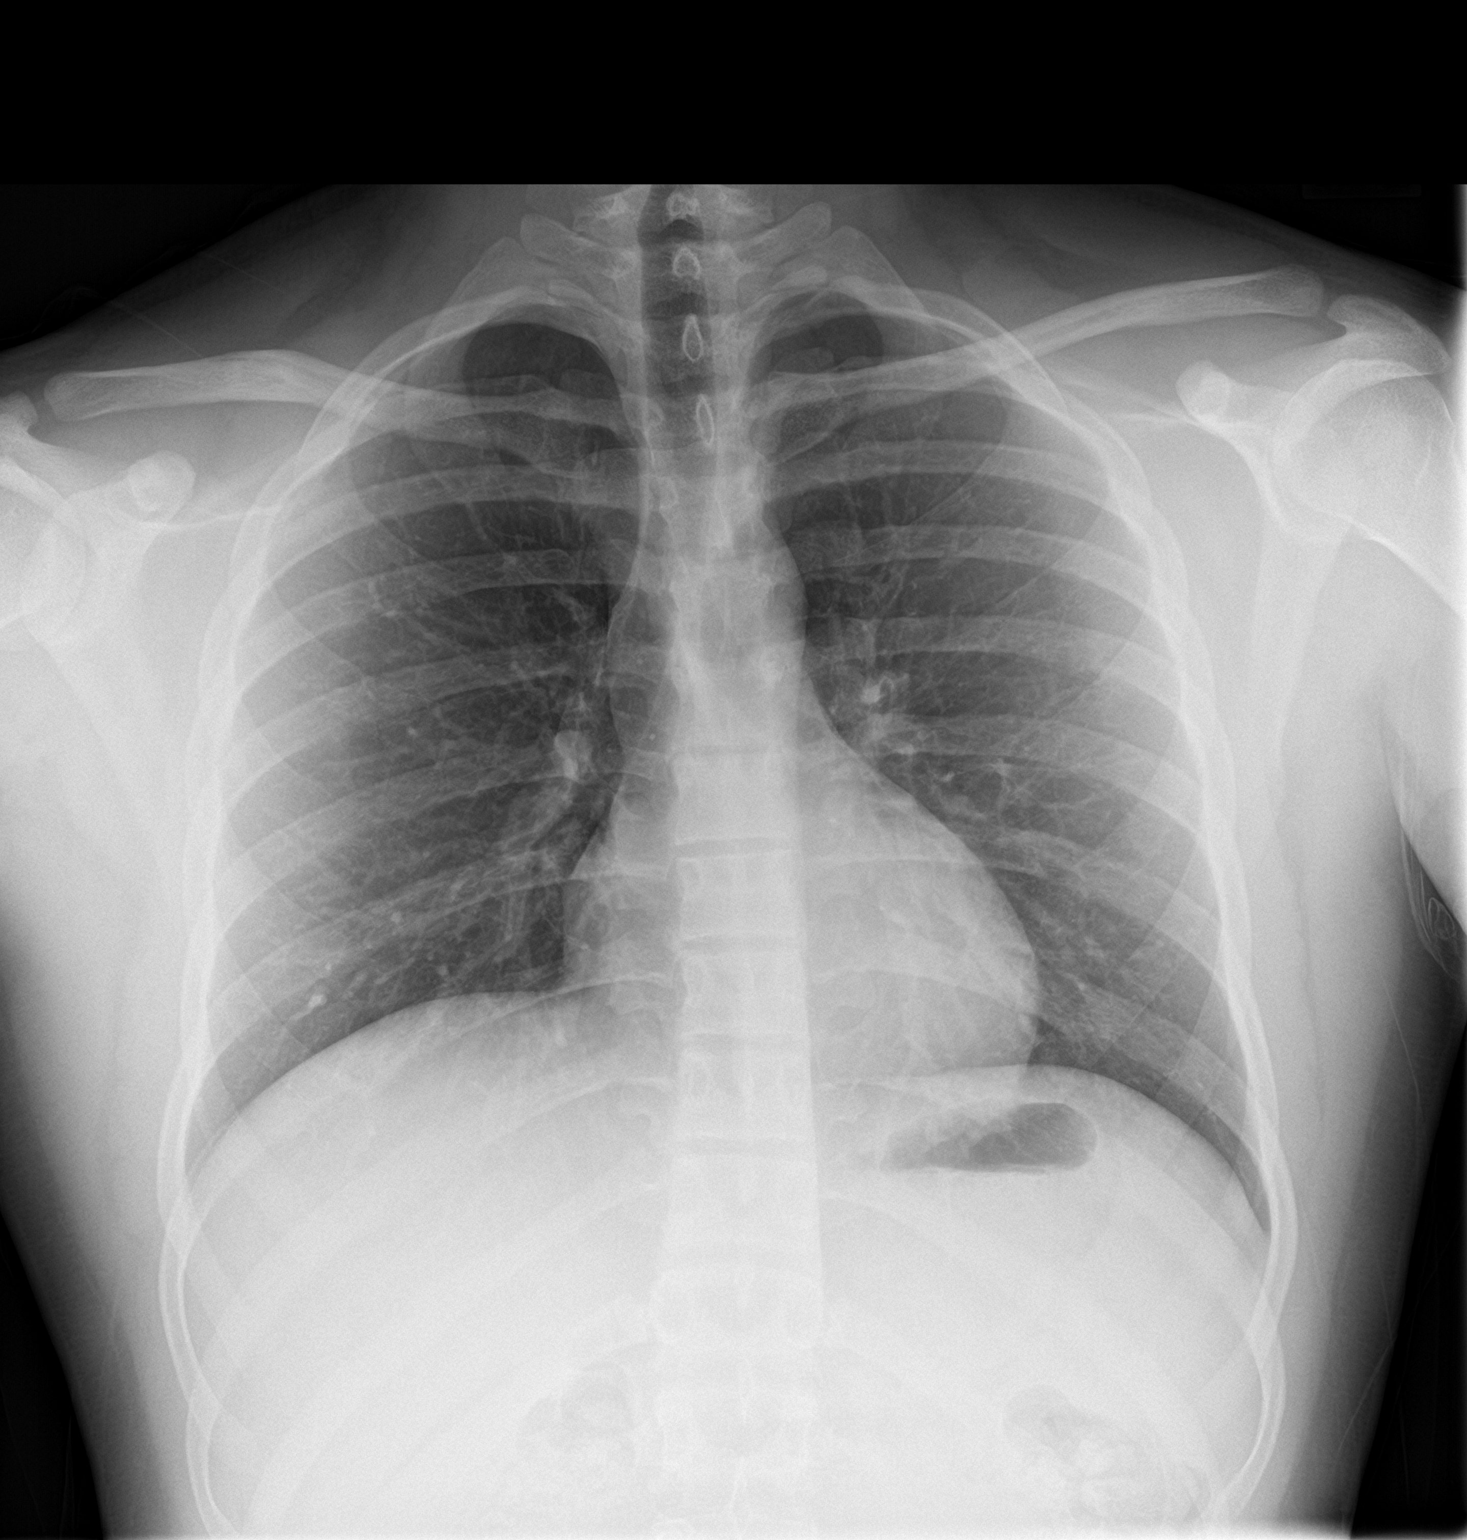
[im 2/2]
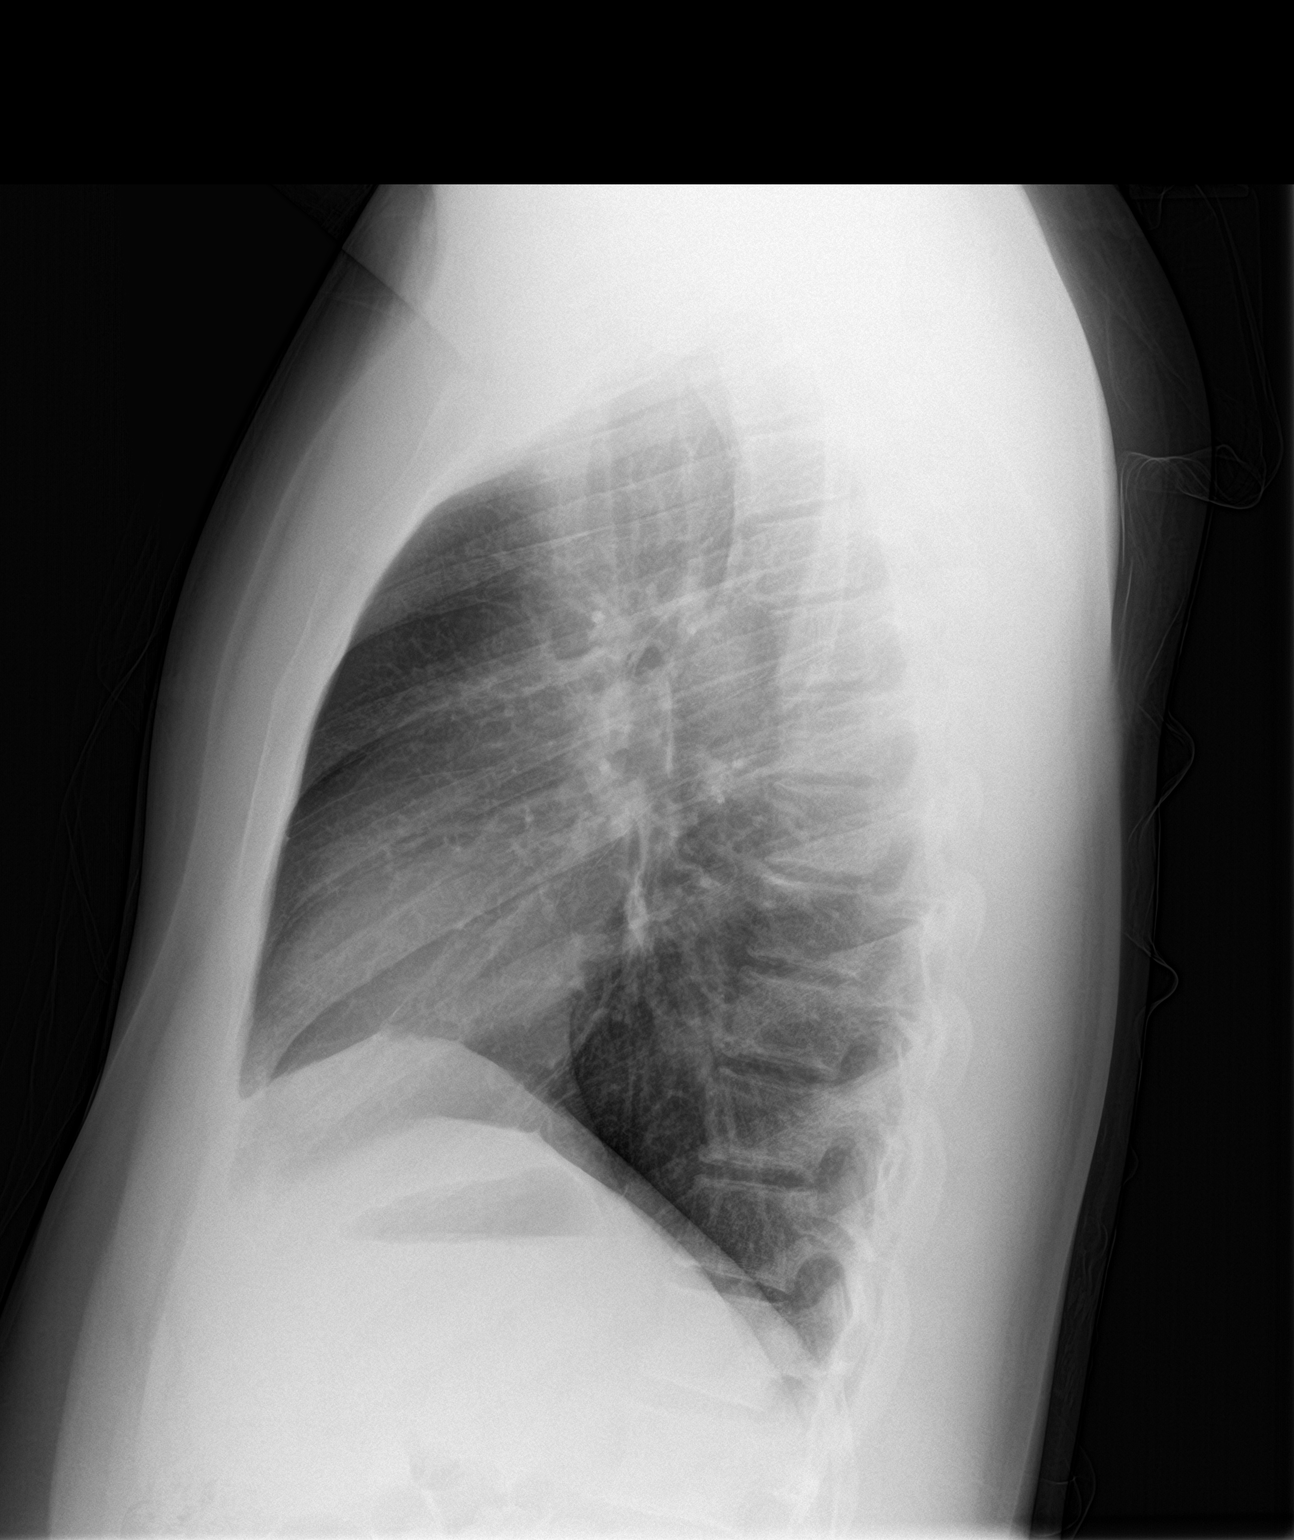

[2 of 2 positions shown; findings below may reference images not displayed]

FINDINGS: The heart size and mediastinal contours are within normal limits.
Both lungs are clear. The visualized skeletal structures are
unremarkable.
IMPRESSION: Normal exam.

## 2021-01-18 ENCOUNTER — Other Ambulatory Visit: Payer: Self-pay | Admitting: Family Medicine

## 2021-01-18 DIAGNOSIS — F902 Attention-deficit hyperactivity disorder, combined type: Secondary | ICD-10-CM

## 2021-01-18 MED ORDER — METHYLPHENIDATE HCL ER (OSM) 54 MG PO TBCR
54.0000 mg | EXTENDED_RELEASE_TABLET | ORAL | 0 refills | Status: DC
Start: 2021-01-18 — End: 2021-02-15

## 2021-02-15 ENCOUNTER — Other Ambulatory Visit: Payer: Self-pay | Admitting: Family Medicine

## 2021-02-15 DIAGNOSIS — F902 Attention-deficit hyperactivity disorder, combined type: Secondary | ICD-10-CM

## 2021-02-15 MED ORDER — METHYLPHENIDATE HCL ER (OSM) 54 MG PO TBCR
54.0000 mg | EXTENDED_RELEASE_TABLET | ORAL | 0 refills | Status: DC
Start: 2021-02-15 — End: 2021-03-12

## 2021-03-08 ENCOUNTER — Other Ambulatory Visit: Payer: Self-pay

## 2021-03-08 ENCOUNTER — Encounter: Payer: Self-pay | Admitting: Family Medicine

## 2021-03-08 ENCOUNTER — Ambulatory Visit (INDEPENDENT_AMBULATORY_CARE_PROVIDER_SITE_OTHER): Payer: BLUE CROSS/BLUE SHIELD | Admitting: Family Medicine

## 2021-03-08 VITALS — BP 124/80 | HR 75 | Ht 64.0 in | Wt 176.8 lb

## 2021-03-08 DIAGNOSIS — S46912A Strain of unspecified muscle, fascia and tendon at shoulder and upper arm level, left arm, initial encounter: Secondary | ICD-10-CM

## 2021-03-08 DIAGNOSIS — F902 Attention-deficit hyperactivity disorder, combined type: Secondary | ICD-10-CM | POA: Diagnosis not present

## 2021-03-08 MED ORDER — MELOXICAM 15 MG PO TABS: 15.0000 mg | ORAL_TABLET | Freq: Every day | ORAL | 0 refills | Status: DC

## 2021-03-08 NOTE — Addendum Note (Signed)
Addended by: Mila Merry E on: 03/08/2021 12:13 PM   Modules accepted: Orders

## 2021-03-08 NOTE — Progress Notes (Signed)
     Established patient visit   Patient: Joshua Green   DOB: [birth year]   20 y.o. Male  MRN: [id number] Visit Date: 03/08/2021  Today's healthcare provider: Mila Merry, MD   Chief Complaint  Patient presents with  . Shoulder Pain   Subjective    Shoulder Pain  The pain is present in the left shoulder. This is a new problem. The current episode started more than 1 month ago. There has been no history of extremity trauma. The problem occurs intermittently (Patient says it hurts worse in morning and it is stiff then as well.). The problem has been unchanged. The quality of the pain is described as sharp. The pain is at a severity of 6/10. The pain is moderate. Associated symptoms include a limited range of motion and stiffness. Pertinent negatives include no fever, joint swelling, numbness or tingling. The symptoms are aggravated by activity. He has tried cold and NSAIDS for the symptoms. The treatment provided no relief. unknown     Medications: Outpatient Medications Prior to Visit  Medication Sig  . albuterol (VENTOLIN HFA) 108 (90 Base) MCG/ACT inhaler Inhale 2 puffs into the lungs every 6 (six) hours as needed for wheezing or shortness of breath.  Marland Kitchen ibuprofen (ADVIL) 600 MG tablet Take 1 tablet (600 mg total) by mouth every 6 (six) hours as needed.  . methylphenidate (CONCERTA) 54 MG PO CR tablet Take 1 tablet (54 mg total) by mouth every morning.  . Baclofen 5 MG TABS Take 5 mg by mouth 3 (three) times daily as needed. (Patient not taking: Reported on 03/08/2021)   No facility-administered medications prior to visit.    Review of Systems  Constitutional: Negative for fever.  Musculoskeletal: Positive for stiffness.  Neurological: Negative for tingling and numbness.      Objective    BP 124/80   Pulse 75   Ht 5\' 4"  (1.626 m)   Wt 176 lb 12.8 oz (80.2 kg)   SpO2 99%   BMI 30.35 kg/m    Physical Exam  General appearance: Overweight male, cooperative and in no  acute distress Head: Normocephalic, without obvious abnormality, atraumatic Respiratory: Respirations even and unlabored, normal respiratory rate Extremities: Tender over left acromuium, no swelling, redness of other gross deformity. Negative impingement sign. FROM of shoulder, just feels 'tight' at limits of ROM    Assessment & Plan     1. Strain of left shoulder, initial encounter - meloxicam (MOBIC) 15 MG tablet; Take 1 tablet (15 mg total) by mouth daily.  Dispense: 30 tablet; Refill: 0  Recommend frequent ice application and rest as needed. Can contact me to request orthopedic referral if not much better in 10-14 days.   2. Attention deficit hyperactivity disorder (ADHD), combined type Doing very well on current dose of methylphenidate. Continue current medications.        The entirety of the information documented in the History of Present Illness, Review of Systems and Physical Exam were personally obtained by me. Portions of this information were initially documented by the CMA and reviewed by me for thoroughness and accuracy.      08-26-1979, MD  [facility] [phone] (phone) [phone] (fax)  [facility]

## 2021-03-12 ENCOUNTER — Telehealth: Payer: Self-pay | Admitting: Family Medicine

## 2021-03-12 DIAGNOSIS — F902 Attention-deficit hyperactivity disorder, combined type: Secondary | ICD-10-CM

## 2021-03-12 MED ORDER — METHYLPHENIDATE HCL ER (OSM) 54 MG PO TBCR: 54.0000 mg | EXTENDED_RELEASE_TABLET | ORAL | 0 refills | Status: DC

## 2021-03-12 NOTE — Telephone Encounter (Signed)
Due for refill 

## 2021-03-21 ENCOUNTER — Other Ambulatory Visit: Payer: Self-pay | Admitting: Family Medicine

## 2021-03-21 DIAGNOSIS — F902 Attention-deficit hyperactivity disorder, combined type: Secondary | ICD-10-CM

## 2021-03-22 MED ORDER — METHYLPHENIDATE HCL ER (OSM) 54 MG PO TBCR
54.0000 mg | EXTENDED_RELEASE_TABLET | ORAL | 0 refills | Status: DC
Start: 2021-03-22 — End: 2021-04-20

## 2021-04-20 ENCOUNTER — Other Ambulatory Visit: Payer: Self-pay | Admitting: Family Medicine

## 2021-04-20 DIAGNOSIS — F902 Attention-deficit hyperactivity disorder, combined type: Secondary | ICD-10-CM

## 2021-04-20 MED ORDER — METHYLPHENIDATE HCL ER (OSM) 54 MG PO TBCR: 54.0000 mg | EXTENDED_RELEASE_TABLET | ORAL | 0 refills | Status: DC

## 2021-05-01 ENCOUNTER — Other Ambulatory Visit: Payer: Self-pay

## 2021-05-01 DIAGNOSIS — Z0283 Encounter for blood-alcohol and blood-drug test: Secondary | ICD-10-CM

## 2021-05-03 ENCOUNTER — Encounter: Payer: Medicaid Other | Admitting: Family Medicine

## 2021-05-16 ENCOUNTER — Ambulatory Visit: Payer: Self-pay | Admitting: Adult Health

## 2021-05-16 ENCOUNTER — Other Ambulatory Visit: Payer: Self-pay

## 2021-05-16 ENCOUNTER — Ambulatory Visit: Payer: Self-pay

## 2021-05-16 ENCOUNTER — Encounter: Payer: Self-pay | Admitting: Adult Health

## 2021-05-16 DIAGNOSIS — Z0000 Encounter for general adult medical examination without abnormal findings: Secondary | ICD-10-CM

## 2021-05-16 DIAGNOSIS — J45909 Unspecified asthma, uncomplicated: Secondary | ICD-10-CM

## 2021-05-16 DIAGNOSIS — F902 Attention-deficit hyperactivity disorder, combined type: Secondary | ICD-10-CM

## 2021-05-16 LAB — POCT URINALYSIS DIPSTICK
Bilirubin, UA: NEGATIVE
Blood, UA: NEGATIVE
Glucose, UA: NEGATIVE
Ketones, UA: NEGATIVE
Leukocytes, UA: NEGATIVE
Nitrite, UA: NEGATIVE
Protein, UA: NEGATIVE
Spec Grav, UA: 1.015 (ref 1.010–1.025)
Urobilinogen, UA: 0.2 E.U./dL
pH, UA: 6.5 (ref 5.0–8.0)

## 2021-05-16 NOTE — Progress Notes (Signed)
Pt presents today to complete labs for pre-employment annual physical with adam scarboro,NP

## 2021-05-16 NOTE — Progress Notes (Signed)
Pt presents today to complete pre-employment annual physical with Mila Merry.  Pt denies any issues or concerns at this time.  Reviewed CDC recommendations for importance of HIV/ Hep C screening once in lifetime. Patient has declined HIV / Hep C screenings today and will let us know if they should change their mind in the future.   Reviewed CDC recommendations for importance of the COVID-19 Vaccine. Pt has declined the vaccine today and for the future.  CL,RMA

## 2021-05-16 NOTE — Progress Notes (Signed)
[facility] [address] [place], [place] [address]  Internal MEDICINE  Office Visit Note  Patient Name: Joshua Green  [id number]  [id number]  Date of Service: 05/16/2021  Chief Complaint  Patient presents with   Annual Exam     HPI Pt is here for routine health maintenance examination.  Mr. Deshmukh is a well appearing 20 yo male.  He has been working with [employer], for the last year and has now been hired with [employer].  He is present today for his pre-employment physical.  He is currently single with no children.  He has a history of ADHD and some Asthma.  Of note, he also had kidney stones as a small child.  No incidences as an adult.  He does not have a formal exercise routine, he does generally work out with his coworkers while on shift, but not every time. He denies tobacco, alcohol and illicit drug use at this time.   Current Medication: Outpatient Encounter Medications as of 05/16/2021  Medication Sig   albuterol (VENTOLIN HFA) 108 (90 Base) MCG/ACT inhaler Inhale 2 puffs into the lungs every 6 (six) hours as needed for wheezing or shortness of breath.   methylphenidate (CONCERTA) 54 MG PO CR tablet Take 1 tablet (54 mg total) by mouth every morning.   [DISCONTINUED] ibuprofen (ADVIL) 600 MG tablet Take 1 tablet (600 mg total) by mouth every 6 (six) hours as needed.   [DISCONTINUED] meloxicam (MOBIC) 15 MG tablet Take 1 tablet (15 mg total) by mouth daily.   No facility-administered encounter medications on file as of 05/16/2021.    Surgical History: Past Surgical History:  Procedure Laterality Date   LITHOTRIPSY     OTHER SURGICAL HISTORY     blood transfusion as an infant   TONSILLECTOMY     Dr. Jenne Campus   TYMPANOSTOMY TUBE PLACEMENT     x3    Medical History: Past Medical History:  Diagnosis Date   ADHD (attention deficit hyperactivity disorder)    Anxiety    Asthma    Kidney stone    Following at [facility]   Premature baby    born at 74 weeks,  NICU for 11 days    Family History: Family History  Problem Relation Age of Onset   Diabetes Mother    Stroke Mother    Coronary artery disease Mother    Colon polyps Mother    Bipolar disorder Mother    Kidney disease Mother    Diabetes Maternal Grandfather    Stroke Maternal Grandfather    Hypertension Maternal Grandfather    Colon cancer Maternal Grandfather    Coronary artery disease Maternal Grandfather    Heart disease Maternal Grandfather    Heart attack Maternal Grandfather    Depression Maternal Grandmother    Bipolar disorder Maternal Grandmother     Social History: Social History   Socioeconomic History   Marital status: Single    Spouse name: Not on file   Number of children: Not on file   Years of education: Not on file   Highest education level: Not on file  Occupational History   Occupation: Theatre stage manager  Tobacco Use   Smoking status: Never   Smokeless tobacco: Never  Substance and Sexual Activity   Alcohol use: No   Drug use: No   Sexual activity: Not on file  Other Topics Concern   Not on file  Social History Narrative   ** Merged History Encounter **  Lives with mom and maternal grandparents. Strained relationship with dad - "Hates his dad".   Social Determinants of Health   Financial Resource Strain: Not on file  Food Insecurity: Not on file  Transportation Needs: Not on file  Physical Activity: Not on file  Stress: Not on file  Social Connections: Not on file      Review of Systems  Constitutional:  Negative for activity change, appetite change and fatigue.  HENT:  Negative for congestion, sinus pain, trouble swallowing and voice change.   Eyes:  Negative for pain, discharge and visual disturbance.  Respiratory:  Negative for cough, chest tightness and shortness of breath.   Cardiovascular:  Negative for chest pain and leg swelling.  Gastrointestinal:  Negative for abdominal distention, abdominal pain, constipation and  diarrhea.  Genitourinary:  Negative for difficulty urinating, dysuria, penile swelling, scrotal swelling and testicular pain.  Musculoskeletal:  Negative for arthralgias, back pain and neck pain.  Skin:  Negative for color change.  Neurological:  Negative for dizziness, weakness and headaches.  Hematological:  Negative for adenopathy.  Psychiatric/Behavioral:  Negative for agitation, confusion and suicidal ideas.     Vital Signs: There were no vitals taken for this visit.   Physical Exam Constitutional:      Appearance: Normal appearance.  HENT:     Head: Normocephalic.     Right Ear: Tympanic membrane normal.     Left Ear: Tympanic membrane normal.     Nose: Nose normal.     Mouth/Throat:     Mouth: Mucous membranes are moist.     Pharynx: No oropharyngeal exudate or posterior oropharyngeal erythema.  Eyes:     General:        Right eye: No discharge.        Left eye: No discharge.     Extraocular Movements: Extraocular movements intact.     Pupils: Pupils are equal, round, and reactive to light.  Cardiovascular:     Rate and Rhythm: Normal rate and regular rhythm.     Pulses: Normal pulses.     Heart sounds: Normal heart sounds. No murmur heard. Pulmonary:     Effort: Pulmonary effort is normal. No respiratory distress.     Breath sounds: Normal breath sounds. No wheezing or rhonchi.  Abdominal:     General: Abdomen is flat. Bowel sounds are normal. There is no distension.     Palpations: There is no mass.     Tenderness: There is no abdominal tenderness. There is no guarding.     Hernia: No hernia is present. There is no hernia in the left inguinal area or right inguinal area.  Genitourinary:    Penis: Circumcised. No phimosis, hypospadias, tenderness, discharge, swelling or lesions.      Testes: Cremasteric reflex is present.        Right: Mass, tenderness or swelling not present.        Left: Mass, tenderness or swelling not present.     Epididymis:     Right: Not  inflamed or enlarged.     Left: Not inflamed or enlarged.  Musculoskeletal:        General: No swelling or deformity. Normal range of motion.     Cervical back: Normal range of motion.  Lymphadenopathy:     Lower Body: No right inguinal adenopathy. No left inguinal adenopathy.  Skin:    General: Skin is warm and dry.     Capillary Refill: Capillary refill takes less than 2 seconds.  Neurological:  General: No focal deficit present.     Mental Status: He is alert.     Cranial Nerves: No cranial nerve deficit.     Gait: Gait normal.  Psychiatric:        Mood and Affect: Mood normal.        Behavior: Behavior normal.        Judgment: Judgment normal.     LABS: Recent Results (from the past 2160 hour(s))  POCT urinalysis dipstick     Status: Normal   Collection Time: 05/16/21  3:29 PM  Result Value Ref Range   Color, UA yellow    Clarity, UA cleaer    Glucose, UA Negative Negative   Bilirubin, UA negative    Ketones, UA negative    Spec Grav, UA 1.015 1.010 - 1.025   Blood, UA negative    pH, UA 6.5 5.0 - 8.0   Protein, UA Negative Negative   Urobilinogen, UA 0.2 0.2 or 1.0 E.U./dL   Nitrite, UA negative    Leukocytes, UA Negative Negative   Appearance medium    Odor       Assessment/Plan: 1. Annual physical exam Pre-employment physical performed.  Follow up in one year for repeat physical.  Will evaluate labs when available.   2. Uncomplicated asthma, unspecified asthma severity, unspecified whether persistent No current issues.  Continue present management.   3. Attention deficit hyperactivity disorder (ADHD), combined type Continue present management with PCP.      General Counseling: greogry goodwyn understanding of the findings of todays visit and agrees with plan of treatment. I have discussed any further diagnostic evaluation that may be needed or ordered today. We also reviewed his medications today. he has been encouraged to call the office with any  questions or concerns that should arise related to todays visit.    No orders of the defined types were placed in this encounter.   No orders of the defined types were placed in this encounter.   Total time spent: 40 Minutes  Time spent includes review of chart, medications, test results, and follow up plan with the patient.    Johnna Acosta AGNP-C Nurse Practitioner

## 2021-05-17 LAB — CMP12+LP+TP+TSH+6AC+CBC/D/PLT
ALT: 25 IU/L (ref 0–44)
AST: 25 IU/L (ref 0–40)
Albumin/Globulin Ratio: 2.5 — ABNORMAL HIGH (ref 1.2–2.2)
Albumin: 5.4 g/dL — ABNORMAL HIGH (ref 4.1–5.2)
Alkaline Phosphatase: 108 IU/L (ref 51–125)
BUN/Creatinine Ratio: 12 (ref 9–20)
BUN: 13 mg/dL (ref 6–20)
Basophils Absolute: 0.1 10*3/uL (ref 0.0–0.2)
Basos: 1 %
Bilirubin Total: 0.4 mg/dL (ref 0.0–1.2)
Calcium: 10 mg/dL (ref 8.7–10.2)
Chloride: 97 mmol/L (ref 96–106)
Chol/HDL Ratio: 5.8 ratio — ABNORMAL HIGH (ref 0.0–5.0)
Cholesterol, Total: 187 mg/dL (ref 100–199)
Creatinine, Ser: 1.12 mg/dL (ref 0.76–1.27)
EOS (ABSOLUTE): 0.2 10*3/uL (ref 0.0–0.4)
Eos: 3 %
Estimated CHD Risk: 1.2 times avg. — ABNORMAL HIGH (ref 0.0–1.0)
Free Thyroxine Index: 1.8 (ref 1.2–4.9)
GGT: 17 IU/L (ref 0–65)
Globulin, Total: 2.2 g/dL (ref 1.5–4.5)
Glucose: 83 mg/dL (ref 65–99)
HDL: 32 mg/dL — ABNORMAL LOW (ref >39)
Hematocrit: 49.4 % (ref 37.5–51.0)
Hemoglobin: 16.4 g/dL (ref 13.0–17.7)
Immature Grans (Abs): 0.1 10*3/uL (ref 0.0–0.1)
Immature Granulocytes: 1 %
Iron: 74 ug/dL (ref 38–169)
LDH: 210 IU/L (ref 121–224)
LDL Chol Calc (NIH): 112 mg/dL — ABNORMAL HIGH (ref 0–99)
Lymphocytes Absolute: 2.3 10*3/uL (ref 0.7–3.1)
Lymphs: 33 %
MCH: 25.3 pg — ABNORMAL LOW (ref 26.6–33.0)
MCHC: 33.2 g/dL (ref 31.5–35.7)
MCV: 76 fL — ABNORMAL LOW (ref 79–97)
Monocytes Absolute: 0.5 10*3/uL (ref 0.1–0.9)
Monocytes: 7 %
Neutrophils Absolute: 3.8 10*3/uL (ref 1.4–7.0)
Neutrophils: 55 %
Phosphorus: 4.7 mg/dL — ABNORMAL HIGH (ref 2.8–4.1)
Platelets: 267 10*3/uL (ref 150–450)
Potassium: 4.4 mmol/L (ref 3.5–5.2)
RBC: 6.47 x10E6/uL — ABNORMAL HIGH (ref 4.14–5.80)
RDW: 13.7 % (ref 11.6–15.4)
Sodium: 139 mmol/L (ref 134–144)
T3 Uptake Ratio: 24 % (ref 24–39)
T4, Total: 7.5 ug/dL (ref 4.5–12.0)
TSH: 3.16 u[IU]/mL (ref 0.450–4.500)
Total Protein: 7.6 g/dL (ref 6.0–8.5)
Triglycerides: 247 mg/dL — ABNORMAL HIGH (ref 0–149)
Uric Acid: 6.6 mg/dL (ref 3.8–8.4)
VLDL Cholesterol Cal: 43 mg/dL — ABNORMAL HIGH (ref 5–40)
WBC: 7 10*3/uL (ref 3.4–10.8)
eGFR: 96 mL/min/{1.73_m2} (ref >59)

## 2021-05-17 LAB — HEPATITIS B SURFACE ANTIBODY,QUALITATIVE: Hep B Surface Ab, Qual: NONREACTIVE

## 2021-05-20 ENCOUNTER — Other Ambulatory Visit: Payer: Self-pay | Admitting: Family Medicine

## 2021-05-20 DIAGNOSIS — F902 Attention-deficit hyperactivity disorder, combined type: Secondary | ICD-10-CM

## 2021-05-20 MED ORDER — METHYLPHENIDATE HCL ER (OSM) 54 MG PO TBCR: 54.0000 mg | EXTENDED_RELEASE_TABLET | ORAL | 0 refills | Status: DC

## 2021-05-22 LAB — QUANTIFERON-TB GOLD PLUS
QuantiFERON Mitogen Value: >10 IU/mL
QuantiFERON Nil Value: 0.07 IU/mL
QuantiFERON TB1 Ag Value: 0.13 IU/mL
QuantiFERON TB2 Ag Value: 0.15 IU/mL
QuantiFERON-TB Gold Plus: NEGATIVE

## 2021-05-23 ENCOUNTER — Ambulatory Visit: Payer: Self-pay | Admitting: Adult Health

## 2021-05-23 ENCOUNTER — Encounter: Payer: Self-pay | Admitting: Adult Health

## 2021-05-23 ENCOUNTER — Other Ambulatory Visit: Payer: Self-pay

## 2021-05-23 VITALS — BP 143/88 | HR 72 | Temp 97.6°F | Resp 14 | Ht 64.0 in | Wt 160.0 lb

## 2021-05-23 DIAGNOSIS — D509 Iron deficiency anemia, unspecified: Secondary | ICD-10-CM

## 2021-05-23 DIAGNOSIS — E782 Mixed hyperlipidemia: Secondary | ICD-10-CM

## 2021-05-23 NOTE — Progress Notes (Signed)
[facility] [place], [place] [address]  Internal MEDICINE  Office Visit Note  Patient Name: Joshua Green  [id number]  [id number]  Date of Service: 05/23/2021  Chief Complaint  Patient presents with   Results    Pt presents today to go over lab work. CL,RMA     HPI Patient is here to follow up on labs.  See Cholesterol panel below.  Also of note, his RBC were 6.47, and MCV and MCH were low at 76 and 25.3 respectively. Upon further assessment of his previous labs.  This seems to be his norm going back to 2014.  He does not recall ever hearing that he had any anemia.   Current Medication: Outpatient Encounter Medications as of 05/23/2021  Medication Sig   albuterol (VENTOLIN HFA) 108 (90 Base) MCG/ACT inhaler Inhale 2 puffs into the lungs every 6 (six) hours as needed for wheezing or shortness of breath.   methylphenidate (CONCERTA) 54 MG PO CR tablet Take 1 tablet (54 mg total) by mouth every morning.   No facility-administered encounter medications on file as of 05/23/2021.    Surgical History: Past Surgical History:  Procedure Laterality Date   LITHOTRIPSY     OTHER SURGICAL HISTORY     blood transfusion as an infant   TONSILLECTOMY     Dr. Tami Ribas   TYMPANOSTOMY TUBE PLACEMENT     x3    Medical History: Past Medical History:  Diagnosis Date   ADHD (attention deficit hyperactivity disorder)    Anxiety    Asthma    Kidney stone    Following at [facility]   Premature baby    born at 23 weeks, NICU for 57 days       Review of Systems  Constitutional: Negative.     Vital Signs: BP (!) 143/88   Pulse 72   Temp 97.6 F (36.4 C)   Resp 14   Ht _0  (1.626 m)   Wt 160 lb (72.6 kg)   SpO2 100%   BMI 27.46 kg/m    Physical Exam Constitutional:      Appearance: Normal appearance.  HENT:     Head: Normocephalic.  Neurological:     Mental Status: He is alert.  Psychiatric:        Mood and Affect: Mood normal.        Behavior:  Behavior normal.     LABS: Recent Results (from the past 2160 hour(s))  QuantiFERON-TB Gold Plus     Status: None   Collection Time: 05/16/21  2:58 AM  Result Value Ref Range   QuantiFERON Incubation Incubation performed.    QuantiFERON Criteria Comment     Comment: The QuantiFERON-TB Gold Plus result is determined by subtracting the Nil value from either TB antigen (Ag) tube. The mitogen tube serves as a control for the test.    QuantiFERON TB1 Ag Value 0.13 IU/mL   QuantiFERON TB2 Ag Value 0.15 IU/mL   QuantiFERON Nil Value 0.07 IU/mL   QuantiFERON Mitogen Value >10.00 IU/mL   QuantiFERON-TB Gold Plus Negative Negative    Comment: Chemiluminescence immunoassay methodology  CMP12+LP+TP+TSH+6AC+CBC/D/Plt     Status: Abnormal   Collection Time: 05/16/21  2:58 PM  Result Value Ref Range   Glucose 83 65 - 99 mg/dL   Uric Acid 6.6 3.8 - 8.4 mg/dL    Comment:            Therapeutic target for gout patients: <6.0   BUN 13  6 - 20 mg/dL   Creatinine, Ser 1.12 0.76 - 1.27 mg/dL   eGFR 96 >59 mL/min/1.73   BUN/Creatinine Ratio 12 9 - 20   Sodium 139 134 - 144 mmol/L   Potassium 4.4 3.5 - 5.2 mmol/L   Chloride 97 96 - 106 mmol/L   Calcium 10.0 8.7 - 10.2 mg/dL   Phosphorus 4.7 (H) 2.8 - 4.1 mg/dL   Total Protein 7.6 6.0 - 8.5 g/dL   Albumin 5.4 (H) 4.1 - 5.2 g/dL   Globulin, Total 2.2 1.5 - 4.5 g/dL   Albumin/Globulin Ratio 2.5 (H) 1.2 - 2.2   Bilirubin Total 0.4 0.0 - 1.2 mg/dL   Alkaline Phosphatase 108 51 - 125 IU/L   LDH 210 121 - 224 IU/L   AST 25 0 - 40 IU/L   ALT 25 0 - 44 IU/L   GGT 17 0 - 65 IU/L   Iron 74 38 - 169 ug/dL   Cholesterol, Total 187 100 - 199 mg/dL   Triglycerides 247 (H) 0 - 149 mg/dL   HDL 32 (L) >39 mg/dL   VLDL Cholesterol Cal 43 (H) 5 - 40 mg/dL   LDL Chol Calc (NIH) 112 (H) 0 - 99 mg/dL   Chol/HDL Ratio 5.8 (H) 0.0 - 5.0 ratio    Comment:                                   T. Chol/HDL Ratio                                             Men  Women                                1/2 Avg.Risk  3.4    3.3                                   Avg.Risk  5.0    4.4                                2X Avg.Risk  9.6    7.1                                3X Avg.Risk 23.4   11.0    Estimated CHD Risk 1.2 (H) 0.0 - 1.0 times avg.    Comment: The CHD Risk is based on the T. Chol/HDL ratio. Other factors affect CHD Risk such as hypertension, smoking, diabetes, severe obesity, and family history of premature CHD.    TSH 3.160 0.450 - 4.500 uIU/mL   T4, Total 7.5 4.5 - 12.0 ug/dL   T3 Uptake Ratio 24 24 - 39 %   Free Thyroxine Index 1.8 1.2 - 4.9   WBC 7.0 3.4 - 10.8 x10E3/uL   RBC 6.47 (H) 4.14 - 5.80 x10E6/uL   Hemoglobin 16.4 13.0 - 17.7 g/dL   Hematocrit 49.4 37.5 - 51.0 %   MCV 76 (L) 79 - 97 fL   MCH 25.3 (L) 26.6 - 33.0 pg   MCHC  33.2 31.5 - 35.7 g/dL   RDW 13.7 11.6 - 15.4 %   Platelets 267 150 - 450 x10E3/uL   Neutrophils 55 Not Estab. %   Lymphs 33 Not Estab. %   Monocytes 7 Not Estab. %   Eos 3 Not Estab. %   Basos 1 Not Estab. %   Neutrophils Absolute 3.8 1.4 - 7.0 x10E3/uL   Lymphocytes Absolute 2.3 0.7 - 3.1 x10E3/uL   Monocytes Absolute 0.5 0.1 - 0.9 x10E3/uL   EOS (ABSOLUTE) 0.2 0.0 - 0.4 x10E3/uL   Basophils Absolute 0.1 0.0 - 0.2 x10E3/uL   Immature Granulocytes 1 Not Estab. %   Immature Grans (Abs) 0.1 0.0 - 0.1 x10E3/uL  Hepatitis B surface antibody,qualitative     Status: None   Collection Time: 05/16/21  2:58 PM  Result Value Ref Range   Hep B Surface Ab, Qual Non Reactive     Comment:               Non Reactive: Inconsistent with immunity,                             less than 10 mIU/mL               Reactive:     Consistent with immunity,                             greater than 9.9 mIU/mL   POCT urinalysis dipstick     Status: Normal   Collection Time: 05/16/21  3:29 PM  Result Value Ref Range   Color, UA yellow    Clarity, UA cleaer    Glucose, UA Negative Negative   Bilirubin, UA negative    Ketones, UA  negative    Spec Grav, UA 1.015 1.010 - 1.025   Blood, UA negative    pH, UA 6.5 5.0 - 8.0   Protein, UA Negative Negative   Urobilinogen, UA 0.2 0.2 or 1.0 E.U./dL   Nitrite, UA negative    Leukocytes, UA Negative Negative   Appearance medium    Odor       Assessment/Plan: 1. Microcytic hypochromic anemia Discussed seeing Hematologist for assessment of anemia vs thalassemia.   2. Elevated triglycerides with high cholesterol Repeat Lipid panel in the morning while fasting. He reports he ate before last blood draw.  -Lipid panel for tomorrow AM.     No orders of the defined types were placed in this encounter.   No orders of the defined types were placed in this encounter.   Total time spent:20 Minutes  Time spent includes review of chart, medications, test results, and follow up plan with the patient.    Kendell Bane AGNP-C Nurse Practitioner

## 2021-05-24 ENCOUNTER — Other Ambulatory Visit: Payer: Self-pay

## 2021-05-24 DIAGNOSIS — E7800 Pure hypercholesterolemia, unspecified: Secondary | ICD-10-CM

## 2021-05-24 NOTE — Progress Notes (Signed)
Pt presents today for lipid panel due to Elevated triglycerides with high cholesterol per Mila Merry,  CL,RMA

## 2021-05-25 LAB — LIPID PANEL
Chol/HDL Ratio: 4.9 ratio (ref 0.0–5.0)
Cholesterol, Total: 151 mg/dL (ref 100–199)
HDL: 31 mg/dL — ABNORMAL LOW (ref >39)
LDL Chol Calc (NIH): 101 mg/dL — ABNORMAL HIGH (ref 0–99)
Triglycerides: 100 mg/dL (ref 0–149)
VLDL Cholesterol Cal: 19 mg/dL (ref 5–40)

## 2021-06-19 ENCOUNTER — Other Ambulatory Visit: Payer: Self-pay | Admitting: Family Medicine

## 2021-06-19 DIAGNOSIS — F902 Attention-deficit hyperactivity disorder, combined type: Secondary | ICD-10-CM

## 2021-06-19 MED ORDER — METHYLPHENIDATE HCL ER (OSM) 54 MG PO TBCR: 54.0000 mg | EXTENDED_RELEASE_TABLET | ORAL | 0 refills | Status: DC

## 2021-07-16 ENCOUNTER — Encounter: Payer: Self-pay | Admitting: Family Medicine

## 2021-07-16 DIAGNOSIS — F902 Attention-deficit hyperactivity disorder, combined type: Secondary | ICD-10-CM

## 2021-07-17 ENCOUNTER — Other Ambulatory Visit: Payer: Self-pay | Admitting: Family Medicine

## 2021-07-17 MED ORDER — METHYLPHENIDATE HCL ER (OSM) 54 MG PO TBCR: 54.0000 mg | EXTENDED_RELEASE_TABLET | ORAL | 0 refills | Status: DC

## 2021-07-17 NOTE — Telephone Encounter (Signed)
Please review refill request.   Last refill: 06/19/2021  Qty: #30 with no refills Last office visit: 03/08/2021

## 2021-08-16 ENCOUNTER — Other Ambulatory Visit: Payer: Self-pay | Admitting: Family Medicine

## 2021-08-16 DIAGNOSIS — F902 Attention-deficit hyperactivity disorder, combined type: Secondary | ICD-10-CM

## 2021-08-16 MED ORDER — METHYLPHENIDATE HCL ER (OSM) 54 MG PO TBCR: 54.0000 mg | EXTENDED_RELEASE_TABLET | ORAL | 0 refills | Status: DC

## 2021-09-13 ENCOUNTER — Other Ambulatory Visit: Payer: Self-pay

## 2021-09-13 DIAGNOSIS — Z0283 Encounter for blood-alcohol and blood-drug test: Secondary | ICD-10-CM

## 2021-09-13 NOTE — Progress Notes (Signed)
Presents to [facility] for Random Drug Screen & Random Breath Alcohol Screen.  [facility] Acct #:  [id number] [facility] Specimen #:  [id number]  AMD

## 2021-09-15 ENCOUNTER — Other Ambulatory Visit: Payer: Self-pay | Admitting: Family Medicine

## 2021-09-15 DIAGNOSIS — F902 Attention-deficit hyperactivity disorder, combined type: Secondary | ICD-10-CM

## 2021-09-16 MED ORDER — METHYLPHENIDATE HCL ER (OSM) 54 MG PO TBCR: 54.0000 mg | EXTENDED_RELEASE_TABLET | ORAL | 0 refills | Status: DC

## 2021-09-16 NOTE — Telephone Encounter (Signed)
LOV: 03/08/2021 NOV: none (pt no showed apt 05/03/2021  Last refill:  08/16/2021 #30 0 Refills.   Thanks,   -Vernona Rieger

## 2021-10-14 ENCOUNTER — Other Ambulatory Visit: Payer: Self-pay | Admitting: Family Medicine

## 2021-10-14 DIAGNOSIS — F902 Attention-deficit hyperactivity disorder, combined type: Secondary | ICD-10-CM

## 2021-10-14 MED ORDER — METHYLPHENIDATE HCL ER (OSM) 54 MG PO TBCR: 54.0000 mg | EXTENDED_RELEASE_TABLET | ORAL | 0 refills | Status: DC

## 2021-11-04 ENCOUNTER — Other Ambulatory Visit: Payer: Self-pay

## 2021-11-04 ENCOUNTER — Ambulatory Visit: Payer: Self-pay

## 2021-11-04 DIAGNOSIS — Z0289 Encounter for other administrative examinations: Secondary | ICD-10-CM

## 2021-11-04 LAB — POCT URINALYSIS DIPSTICK
Bilirubin, UA: NEGATIVE
Blood, UA: NEGATIVE
Glucose, UA: NEGATIVE
Ketones, UA: NEGATIVE
Leukocytes, UA: NEGATIVE
Nitrite, UA: NEGATIVE
Protein, UA: NEGATIVE
Spec Grav, UA: >=1.03 — AB (ref 1.010–1.025)
Urobilinogen, UA: 0.2 E.U./dL
pH, UA: 5.5 (ref 5.0–8.0)

## 2021-11-04 NOTE — Progress Notes (Signed)
Annual physical scheduled 11/13/21

## 2021-11-05 LAB — CMP12+LP+TP+TSH+6AC+CBC/D/PLT
ALT: 27 IU/L (ref 0–44)
AST: 24 IU/L (ref 0–40)
Albumin/Globulin Ratio: 2.1 (ref 1.2–2.2)
Albumin: 5 g/dL (ref 4.1–5.2)
Alkaline Phosphatase: 97 IU/L (ref 51–125)
BUN/Creatinine Ratio: 12 (ref 9–20)
BUN: 13 mg/dL (ref 6–20)
Basophils Absolute: 0.1 10*3/uL (ref 0.0–0.2)
Basos: 1 %
Bilirubin Total: 0.3 mg/dL (ref 0.0–1.2)
Calcium: 10.1 mg/dL (ref 8.7–10.2)
Chloride: 102 mmol/L (ref 96–106)
Chol/HDL Ratio: 5.3 ratio — ABNORMAL HIGH (ref 0.0–5.0)
Cholesterol, Total: 181 mg/dL (ref 100–199)
Creatinine, Ser: 1.07 mg/dL (ref 0.76–1.27)
EOS (ABSOLUTE): 0.3 10*3/uL (ref 0.0–0.4)
Eos: 4 %
Estimated CHD Risk: 1.1 times avg. — ABNORMAL HIGH (ref 0.0–1.0)
Free Thyroxine Index: 1.6 (ref 1.2–4.9)
GGT: 19 IU/L (ref 0–65)
Globulin, Total: 2.4 g/dL (ref 1.5–4.5)
Glucose: 97 mg/dL (ref 70–99)
HDL: 34 mg/dL — ABNORMAL LOW (ref >39)
Hematocrit: 49.4 % (ref 37.5–51.0)
Hemoglobin: 16.4 g/dL (ref 13.0–17.7)
Immature Grans (Abs): 0 10*3/uL (ref 0.0–0.1)
Immature Granulocytes: 1 %
Iron: 70 ug/dL (ref 38–169)
LDH: 154 IU/L (ref 121–224)
LDL Chol Calc (NIH): 120 mg/dL — ABNORMAL HIGH (ref 0–99)
Lymphocytes Absolute: 2.2 10*3/uL (ref 0.7–3.1)
Lymphs: 34 %
MCH: 25.2 pg — ABNORMAL LOW (ref 26.6–33.0)
MCHC: 33.2 g/dL (ref 31.5–35.7)
MCV: 76 fL — ABNORMAL LOW (ref 79–97)
Monocytes Absolute: 0.5 10*3/uL (ref 0.1–0.9)
Monocytes: 8 %
Neutrophils Absolute: 3.3 10*3/uL (ref 1.4–7.0)
Neutrophils: 52 %
Phosphorus: 4.3 mg/dL — ABNORMAL HIGH (ref 2.8–4.1)
Platelets: 290 10*3/uL (ref 150–450)
Potassium: 4.7 mmol/L (ref 3.5–5.2)
RBC: 6.52 x10E6/uL — ABNORMAL HIGH (ref 4.14–5.80)
RDW: 13.4 % (ref 11.6–15.4)
Sodium: 143 mmol/L (ref 134–144)
T3 Uptake Ratio: 23 % — ABNORMAL LOW (ref 24–39)
T4, Total: 7 ug/dL (ref 4.5–12.0)
TSH: 3.13 u[IU]/mL (ref 0.450–4.500)
Total Protein: 7.4 g/dL (ref 6.0–8.5)
Triglycerides: 150 mg/dL — ABNORMAL HIGH (ref 0–149)
Uric Acid: 6.6 mg/dL (ref 3.8–8.4)
VLDL Cholesterol Cal: 27 mg/dL (ref 5–40)
WBC: 6.4 10*3/uL (ref 3.4–10.8)
eGFR: 102 mL/min/{1.73_m2} (ref >59)

## 2021-11-11 ENCOUNTER — Other Ambulatory Visit: Payer: Self-pay | Admitting: Family Medicine

## 2021-11-11 DIAGNOSIS — F902 Attention-deficit hyperactivity disorder, combined type: Secondary | ICD-10-CM

## 2021-11-12 MED ORDER — METHYLPHENIDATE HCL ER (OSM) 54 MG PO TBCR: 54.0000 mg | EXTENDED_RELEASE_TABLET | ORAL | 0 refills | Status: DC

## 2021-11-13 ENCOUNTER — Encounter: Payer: Self-pay | Admitting: Physician Assistant

## 2021-11-13 ENCOUNTER — Other Ambulatory Visit: Payer: Self-pay

## 2021-11-13 ENCOUNTER — Ambulatory Visit: Payer: Self-pay | Admitting: Physician Assistant

## 2021-11-13 VITALS — BP 120/70 | HR 72 | Temp 98.2°F | Resp 12 | Ht 64.0 in | Wt 180.0 lb

## 2021-11-13 DIAGNOSIS — Z0289 Encounter for other administrative examinations: Secondary | ICD-10-CM

## 2021-11-13 NOTE — Progress Notes (Signed)
States he doesn't have a albuterol inhaler & hasn't had one for awhile.  Taking Mucinex for allergies to clear out sinuses & sinus drainage.  States this is normal for him this time a year, especially with working outside.

## 2021-11-13 NOTE — Progress Notes (Signed)
° °  Subjective: Annual firefighter exam    Patient ID: Joshua Green, male    DOB: May 25, 2001, 20 y.o.   MRN: [id number]  HPI Patient presents for annual firefighter exam.  Patient was no concerns or complaints.   Review of Systems ADHD and asthma    Objective:   Physical Exam  No acute distress.  Temperature 98.2, pulse 72, respiration 12, BP is 120/70, patient 97% O2 sat on room air.  Patient weighs 180 pounds BMI is 30.9. HEENT is unremarkable.  Neck is supple without lymphadenopathy or bruits.  Lungs are clear to auscultation.  Heart regular rate and rhythm.  EKG shows sinus bradycardia. Abdomen with negative HSM, normoactive bowel sounds, soft, nontender to palpation. No obvious deformity to the upper or lower extremities.  Patient has full and equal range of motion of the upper and lower extremities. No obvious cervical or lumbar spine deformity.  Patient has full and equal range of motion cervical lumbar spine. Cranial nerves II through XII are grossly intact.  DTRs 2+ without clonus.      Assessment & Plan: Well exam.   Discussed lab results with patient.  Follow-up as necessary.

## 2021-12-11 ENCOUNTER — Encounter: Payer: Self-pay | Admitting: Family Medicine

## 2021-12-14 ENCOUNTER — Other Ambulatory Visit: Payer: Self-pay | Admitting: Family Medicine

## 2021-12-14 DIAGNOSIS — F902 Attention-deficit hyperactivity disorder, combined type: Secondary | ICD-10-CM

## 2021-12-16 MED ORDER — METHYLPHENIDATE HCL ER (LA) 40 MG PO CP24: 40.0000 mg | ORAL_CAPSULE | ORAL | 0 refills | Status: DC

## 2022-01-13 ENCOUNTER — Other Ambulatory Visit: Payer: Self-pay | Admitting: Family Medicine

## 2022-01-13 MED ORDER — METHYLPHENIDATE HCL ER (LA) 40 MG PO CP24: 40.0000 mg | ORAL_CAPSULE | ORAL | 0 refills | Status: DC

## 2022-02-11 ENCOUNTER — Other Ambulatory Visit: Payer: Self-pay | Admitting: Family Medicine

## 2022-02-11 MED ORDER — METHYLPHENIDATE HCL ER (LA) 40 MG PO CP24: 40.0000 mg | ORAL_CAPSULE | ORAL | 0 refills | Status: DC

## 2022-03-12 ENCOUNTER — Other Ambulatory Visit: Payer: Self-pay | Admitting: Family Medicine

## 2022-03-12 MED ORDER — METHYLPHENIDATE HCL ER (LA) 40 MG PO CP24: 40.0000 mg | ORAL_CAPSULE | ORAL | 0 refills | Status: DC

## 2022-04-11 ENCOUNTER — Other Ambulatory Visit: Payer: Self-pay | Admitting: Family Medicine

## 2022-04-11 MED ORDER — METHYLPHENIDATE HCL ER (LA) 40 MG PO CP24: 40.0000 mg | ORAL_CAPSULE | ORAL | 0 refills | Status: DC

## 2022-05-12 ENCOUNTER — Other Ambulatory Visit: Payer: Self-pay | Admitting: Family Medicine

## 2022-05-13 MED ORDER — METHYLPHENIDATE HCL ER (LA) 40 MG PO CP24: 40.0000 mg | ORAL_CAPSULE | ORAL | 0 refills | Status: DC

## 2022-06-11 ENCOUNTER — Other Ambulatory Visit: Payer: Self-pay | Admitting: Family Medicine

## 2022-06-14 ENCOUNTER — Other Ambulatory Visit: Payer: Self-pay | Admitting: Family Medicine

## 2022-06-15 MED ORDER — METHYLPHENIDATE HCL ER (LA) 40 MG PO CP24: 40.0000 mg | ORAL_CAPSULE | ORAL | 0 refills | Status: DC

## 2022-07-10 NOTE — Progress Notes (Signed)
I,Shafter Jupin Robinson,acting as a Neurosurgeon for [employer], PA-C.,have documented all relevant documentation on the behalf of Debera Lat, PA-C,as directed by  [employer], PA-C while in the presence of [employer], PA-C.   Established patient visit   Patient: Joshua Green   DOB: [birth year]   20 y.o. Male  MRN: [id number] Visit Date: 07/11/2022  Today's healthcare provider: Debera Lat, PA-C   Chief Complaint  Patient presents with   ADHD   Subjective    Follow up for Attention deficit hyperactivity disorder (ADHD), combined type  The patient was last seen for this on 03-08-21. Changes made at last visit include continue methylphenidate. He reports good compliance with treatment. He feels that condition is Improved. He is not having side effects.   -----------------------------------------------------------------------------------------    07/11/2022    8:17 AM  GAD 7 : Generalized Anxiety Score  Nervous, Anxious, on Edge 0  Control/stop worrying 0  Worry too much - different things 0  Trouble relaxing 1  Restless 0  Easily annoyed or irritable 1  Afraid - awful might happen 0  Total GAD 7 Score 2  Anxiety Difficulty Not difficult at all   Flowsheet Row Office Visit from 07/11/2022 in [facility]  PHQ-2 Total Score 0     See updated ADHD chart     Medications: Outpatient Medications Prior to Visit  Medication Sig   methylphenidate (RITALIN LA) 40 MG 24 hr capsule Take 1 capsule (40 mg total) by mouth every morning.   albuterol (VENTOLIN HFA) 108 (90 Base) MCG/ACT inhaler Inhale 2 puffs into the lungs every 6 (six) hours as needed for wheezing or shortness of breath. (Patient not taking: Reported on 11/13/2021)   No facility-administered medications prior to visit.    Review of Systems  All other systems reviewed and are negative.  Except see HPI    Objective    BP (!) 140/90 (BP Location: Left Arm, Patient Position: Sitting, Cuff  Size: Normal)   Pulse 72   Temp 98 F (36.7 C) (Oral)   Resp 16   Wt 185 lb 1.6 oz (84 kg)   SpO2 100%   BMI 31.77 kg/m    Physical Exam Constitutional:      General: He is not in acute distress.    Appearance: Normal appearance. He is not diaphoretic.  HENT:     Head: Normocephalic.  Eyes:     Conjunctiva/sclera: Conjunctivae normal.  Cardiovascular:     Rate and Rhythm: Normal rate.  Pulmonary:     Effort: Pulmonary effort is normal. No respiratory distress.  Abdominal:     General: Abdomen is flat.  Neurological:     Mental Status: He is alert and oriented to person, place, and time. Mental status is at baseline.  Psychiatric:        Mood and Affect: Mood normal.        Behavior: Behavior normal.        Thought Content: Thought content normal.        Judgment: Judgment normal.      No results found for any visits on 07/11/22.  Assessment & Plan      Attention deficit hyperactivity disorder (ADHD), combined type Chronic. Well-controlled He is going to beach next Wednesday and would like to have Ritalin before the trip PRMP reviewed. - methylphenidate (RITALIN LA) 40 MG 24 hr capsule; Take 1 capsule (40 mg total) by mouth every morning.  Dispense: 30 capsule; Refill: 0  Grief Pt lost his mother recently. PHQ9 score is 0 GAD7 score is 2 ADHD see the chart  Elevated BP  BP today was 140/90 Pt prefers to proceed with lifestyle modifications vs medication Advised to measure his BP and record his findings.  Obesity BMI 31.77 Lifestyle modification recommended.  FU PRN. Prefers to not schedule his FU appt today. CPE through the fire department  The patient was advised to call back or seek an in-person evaluation if the symptoms worsen or if the condition fails to improve as anticipated.  I discussed the assessment and treatment plan with the patient. The patient was provided an opportunity to ask questions and all were answered. The patient agreed with the  plan and demonstrated an understanding of the instructions.  The entirety of the information documented in the History of Present Illness, Review of Systems and Physical Exam were personally obtained by me. Portions of this information were initially documented by the CMA and reviewed by me for thoroughness and accuracy.  Portions of this note were created using dictation software and may contain typographical errors.      Debera Lat, PA-C  [facility] [phone] (phone) [phone] (fax)  [facility]

## 2022-07-11 ENCOUNTER — Ambulatory Visit (INDEPENDENT_AMBULATORY_CARE_PROVIDER_SITE_OTHER): Payer: 59 | Admitting: Physician Assistant

## 2022-07-11 ENCOUNTER — Encounter: Payer: Self-pay | Admitting: Physician Assistant

## 2022-07-11 VITALS — BP 140/90 | HR 72 | Temp 98.0°F | Resp 16 | Wt 185.1 lb

## 2022-07-11 DIAGNOSIS — R69 Illness, unspecified: Secondary | ICD-10-CM | POA: Diagnosis not present

## 2022-07-11 DIAGNOSIS — F4321 Adjustment disorder with depressed mood: Secondary | ICD-10-CM | POA: Diagnosis not present

## 2022-07-11 DIAGNOSIS — F902 Attention-deficit hyperactivity disorder, combined type: Secondary | ICD-10-CM

## 2022-07-11 DIAGNOSIS — R030 Elevated blood-pressure reading, without diagnosis of hypertension: Secondary | ICD-10-CM | POA: Diagnosis not present

## 2022-07-11 DIAGNOSIS — E669 Obesity, unspecified: Secondary | ICD-10-CM

## 2022-07-11 MED ORDER — METHYLPHENIDATE HCL ER (LA) 40 MG PO CP24: 40.0000 mg | ORAL_CAPSULE | ORAL | 0 refills | Status: DC

## 2022-07-13 ENCOUNTER — Other Ambulatory Visit: Payer: Self-pay | Admitting: Physician Assistant

## 2022-07-13 DIAGNOSIS — F902 Attention-deficit hyperactivity disorder, combined type: Secondary | ICD-10-CM

## 2022-07-13 MED ORDER — METHYLPHENIDATE HCL ER (LA) 40 MG PO CP24: 40.0000 mg | ORAL_CAPSULE | ORAL | 0 refills | Status: DC

## 2022-08-12 ENCOUNTER — Other Ambulatory Visit: Payer: Self-pay | Admitting: Family Medicine

## 2022-08-12 DIAGNOSIS — F902 Attention-deficit hyperactivity disorder, combined type: Secondary | ICD-10-CM

## 2022-08-13 ENCOUNTER — Other Ambulatory Visit: Payer: Self-pay | Admitting: Family Medicine

## 2022-08-13 DIAGNOSIS — F902 Attention-deficit hyperactivity disorder, combined type: Secondary | ICD-10-CM

## 2022-08-13 MED ORDER — METHYLPHENIDATE HCL ER (LA) 40 MG PO CP24: 40.0000 mg | ORAL_CAPSULE | ORAL | 0 refills | Status: DC

## 2022-08-13 NOTE — Telephone Encounter (Signed)
I called and spoke with patient. He states that [facility] is out of stock. They have called [facility] [address] and confirmed that [facility] [address] has it in stock. Patient is requesting that Dr. Sherrie Mustache re-send prescription to [facility] [address].

## 2022-08-13 NOTE — Telephone Encounter (Signed)
Pt stated the Rx for methylphenidate (RITALIN LA) 40 MG 24 hr capsule is not in stock at his pharmacy so he would like for the Rx to be sent to  [facility] [address] [place] - [address] Phone:  [phone]  Fax:  [phone]

## 2022-08-14 MED ORDER — METHYLPHENIDATE HCL ER (LA) 40 MG PO CP24: 40.0000 mg | ORAL_CAPSULE | ORAL | 0 refills | Status: DC

## 2022-09-11 ENCOUNTER — Other Ambulatory Visit: Payer: Self-pay | Admitting: Family Medicine

## 2022-09-11 DIAGNOSIS — F902 Attention-deficit hyperactivity disorder, combined type: Secondary | ICD-10-CM

## 2022-09-11 MED ORDER — METHYLPHENIDATE HCL ER (LA) 40 MG PO CP24: 40.0000 mg | ORAL_CAPSULE | ORAL | 0 refills | Status: DC

## 2022-10-10 ENCOUNTER — Other Ambulatory Visit: Payer: Self-pay | Admitting: Family Medicine

## 2022-10-10 DIAGNOSIS — F902 Attention-deficit hyperactivity disorder, combined type: Secondary | ICD-10-CM

## 2022-10-10 MED ORDER — METHYLPHENIDATE HCL ER (LA) 40 MG PO CP24: 40.0000 mg | ORAL_CAPSULE | ORAL | 0 refills | Status: DC

## 2022-11-10 ENCOUNTER — Other Ambulatory Visit: Payer: Self-pay | Admitting: Family Medicine

## 2022-11-10 DIAGNOSIS — F902 Attention-deficit hyperactivity disorder, combined type: Secondary | ICD-10-CM

## 2022-11-10 MED ORDER — METHYLPHENIDATE HCL ER (LA) 40 MG PO CP24: 40.0000 mg | ORAL_CAPSULE | ORAL | 0 refills | Status: DC

## 2022-11-20 DIAGNOSIS — R509 Fever, unspecified: Secondary | ICD-10-CM | POA: Diagnosis not present

## 2022-11-20 DIAGNOSIS — J101 Influenza due to other identified influenza virus with other respiratory manifestations: Secondary | ICD-10-CM | POA: Diagnosis not present

## 2022-11-20 DIAGNOSIS — Z20822 Contact with and (suspected) exposure to covid-19: Secondary | ICD-10-CM | POA: Diagnosis not present

## 2022-12-09 ENCOUNTER — Telehealth: Payer: Self-pay | Admitting: Family Medicine

## 2022-12-09 DIAGNOSIS — F902 Attention-deficit hyperactivity disorder, combined type: Secondary | ICD-10-CM

## 2022-12-09 MED ORDER — METHYLPHENIDATE HCL ER (LA) 40 MG PO CP24: 40.0000 mg | ORAL_CAPSULE | ORAL | 0 refills | Status: DC

## 2022-12-10 MED ORDER — METHYLPHENIDATE HCL ER (LA) 40 MG PO CP24: 40.0000 mg | ORAL_CAPSULE | ORAL | 0 refills | Status: DC

## 2022-12-10 NOTE — Addendum Note (Signed)
Addended by: Birdie Sons on: 12/10/2022 01:56 PM   Modules accepted: Orders

## 2022-12-10 NOTE — Telephone Encounter (Signed)
Pt called to have this medication sent to the [facility] in [place], his current pharmacy is out of stock. Please advise

## 2022-12-15 NOTE — Progress Notes (Unsigned)
     I,Joshua Green,acting as a Education administrator for Joshua Huh, MD.,have documented all relevant documentation on the behalf of Joshua Huh, MD,as directed by  Joshua Huh, MD while in the presence of Joshua Huh, MD.   Established patient visit   Patient: Joshua Green   DOB: [birth year]   22 y.o. Male  MRN: [id number] Visit Date: 12/16/2022  Today's healthcare provider: [name], MD   No chief complaint on file.  Subjective    Epistaxis  The bleeding has been from both nares. This is a new problem. The current episode started 1 to 4 weeks ago. The problem occurs daily. The problem has been gradually worsening. The bleeding is associated with nothing. He has tried pressure for the symptoms. The treatment provided mild relief. His past medical history is significant for colds.  Stopped about a week or 2 ago. Reports they were last 2 - 3 minutes. Would sometimes occur on the right, other times on the left. Has had some mild congestion on and off.   He also reports he continues to take methylphenidate consistently for ADHD symptoms. It remains effective. Is able to concentrate and focus much better when he takes it. No trouble sleeping, appetite changes, mood changes, heart palpations or chest discomfort.  Medications: Outpatient Medications Prior to Visit  Medication Sig   albuterol (VENTOLIN HFA) 108 (90 Base) MCG/ACT inhaler Inhale 2 puffs into the lungs every 6 (six) hours as needed for wheezing or shortness of breath. (Patient not taking: Reported on 11/13/2021)   methylphenidate (RITALIN LA) 40 MG 24 hr capsule Take 1 capsule (40 mg total) by mouth every morning.   No facility-administered medications prior to visit.    Review of Systems  HENT:  Positive for nosebleeds.       Objective    BP 128/81 (BP Location: Right Arm, Patient Position: Sitting, Cuff Size: Large)   Pulse 89   Ht 5\' 4"  (1.626 m)   Wt 181 lb 12.8 oz (82.5 kg)   SpO2 100%   BMI 31.21 kg/m     Physical Exam  No lesions, masses, or other irregularities of nasal passages, septum, or pharynx.    Assessment & Plan     1. Epistaxis Now resolved for the last week, with normal exam of nares and visible area of nasal pharynx. Suspect septum had been dried and irritated. Recommend apply barrier to septum such as Vaseline. If sx return and barrier is not effective then would refer to ENT  2. Attention deficit hyperactivity disorder (ADHD), combined type Doing well on methylphenidate which he takes consistently every day with no adverse effects.       The entirety of the information documented in the History of Present Illness, Review of Systems and Physical Exam were personally obtained by me. Portions of this information were initially documented by the CMA and reviewed by me for thoroughness and accuracy.     Joshua Huh, MD  [facility] [phone] (phone) [phone] (fax)  [place]

## 2022-12-16 ENCOUNTER — Ambulatory Visit (INDEPENDENT_AMBULATORY_CARE_PROVIDER_SITE_OTHER): Payer: 59 | Admitting: Family Medicine

## 2022-12-16 ENCOUNTER — Encounter: Payer: Self-pay | Admitting: Family Medicine

## 2022-12-16 VITALS — BP 128/81 | HR 89 | Ht 64.0 in | Wt 181.8 lb

## 2022-12-16 DIAGNOSIS — F902 Attention-deficit hyperactivity disorder, combined type: Secondary | ICD-10-CM | POA: Diagnosis not present

## 2022-12-16 DIAGNOSIS — R69 Illness, unspecified: Secondary | ICD-10-CM | POA: Diagnosis not present

## 2022-12-16 DIAGNOSIS — R040 Epistaxis: Secondary | ICD-10-CM

## 2022-12-22 ENCOUNTER — Ambulatory Visit: Payer: Self-pay

## 2022-12-22 DIAGNOSIS — Z0000 Encounter for general adult medical examination without abnormal findings: Secondary | ICD-10-CM

## 2022-12-22 LAB — POCT URINALYSIS DIPSTICK
Blood, UA: NEGATIVE
Glucose, UA: NEGATIVE
Ketones, UA: NEGATIVE
Leukocytes, UA: NEGATIVE
Nitrite, UA: NEGATIVE
Protein, UA: NEGATIVE
Spec Grav, UA: >=1.03 — AB (ref 1.010–1.025)
Urobilinogen, UA: 0.2 E.U./dL
pH, UA: 6 (ref 5.0–8.0)

## 2022-12-22 NOTE — Progress Notes (Signed)
Pt completed labs for Fire psychical. Pt scheduled to return and complete.

## 2022-12-23 LAB — CMP12+LP+TP+TSH+6AC+CBC/D/PLT
ALT: 23 IU/L (ref 0–44)
AST: 19 IU/L (ref 0–40)
Albumin/Globulin Ratio: 2 (ref 1.2–2.2)
Albumin: 5.1 g/dL (ref 4.3–5.2)
Alkaline Phosphatase: 95 IU/L (ref 44–121)
BUN/Creatinine Ratio: 19 (ref 9–20)
BUN: 19 mg/dL (ref 6–20)
Basophils Absolute: 0.1 10*3/uL (ref 0.0–0.2)
Basos: 1 %
Bilirubin Total: 0.3 mg/dL (ref 0.0–1.2)
Calcium: 9.9 mg/dL (ref 8.7–10.2)
Chloride: 99 mmol/L (ref 96–106)
Chol/HDL Ratio: 6.3 ratio — ABNORMAL HIGH (ref 0.0–5.0)
Cholesterol, Total: 201 mg/dL — ABNORMAL HIGH (ref 100–199)
Creatinine, Ser: 1.02 mg/dL (ref 0.76–1.27)
EOS (ABSOLUTE): 0.2 10*3/uL (ref 0.0–0.4)
Eos: 3 %
Estimated CHD Risk: 1.3 times avg. — ABNORMAL HIGH (ref 0.0–1.0)
Free Thyroxine Index: 1.6 (ref 1.2–4.9)
GGT: 21 IU/L (ref 0–65)
Globulin, Total: 2.5 g/dL (ref 1.5–4.5)
Glucose: 94 mg/dL (ref 70–99)
HDL: 32 mg/dL — ABNORMAL LOW (ref >39)
Hematocrit: 48.7 % (ref 37.5–51.0)
Hemoglobin: 15.5 g/dL (ref 13.0–17.7)
Immature Grans (Abs): 0 10*3/uL (ref 0.0–0.1)
Immature Granulocytes: 0 %
Iron: 72 ug/dL (ref 38–169)
LDH: 145 IU/L (ref 121–224)
LDL Chol Calc (NIH): 146 mg/dL — ABNORMAL HIGH (ref 0–99)
Lymphocytes Absolute: 2.6 10*3/uL (ref 0.7–3.1)
Lymphs: 36 %
MCH: 24.8 pg — ABNORMAL LOW (ref 26.6–33.0)
MCHC: 31.8 g/dL (ref 31.5–35.7)
MCV: 78 fL — ABNORMAL LOW (ref 79–97)
Monocytes Absolute: 0.6 10*3/uL (ref 0.1–0.9)
Monocytes: 8 %
Neutrophils Absolute: 3.6 10*3/uL (ref 1.4–7.0)
Neutrophils: 52 %
Phosphorus: 4.3 mg/dL — ABNORMAL HIGH (ref 2.8–4.1)
Platelets: 294 10*3/uL (ref 150–450)
Potassium: 4.7 mmol/L (ref 3.5–5.2)
RBC: 6.26 x10E6/uL — ABNORMAL HIGH (ref 4.14–5.80)
RDW: 15.1 % (ref 11.6–15.4)
Sodium: 140 mmol/L (ref 134–144)
T3 Uptake Ratio: 23 % — ABNORMAL LOW (ref 24–39)
T4, Total: 6.9 ug/dL (ref 4.5–12.0)
TSH: 2.81 u[IU]/mL (ref 0.450–4.500)
Total Protein: 7.6 g/dL (ref 6.0–8.5)
Triglycerides: 128 mg/dL (ref 0–149)
Uric Acid: 7.3 mg/dL (ref 3.8–8.4)
VLDL Cholesterol Cal: 23 mg/dL (ref 5–40)
WBC: 7.1 10*3/uL (ref 3.4–10.8)
eGFR: 107 mL/min/{1.73_m2} (ref >59)

## 2022-12-25 ENCOUNTER — Encounter: Payer: Self-pay | Admitting: Physician Assistant

## 2022-12-25 ENCOUNTER — Ambulatory Visit: Payer: Self-pay | Admitting: Physician Assistant

## 2022-12-25 VITALS — BP 146/87 | HR 65 | Temp 98.2°F | Resp 14 | Wt 179.0 lb

## 2022-12-25 DIAGNOSIS — Z0000 Encounter for general adult medical examination without abnormal findings: Secondary | ICD-10-CM

## 2022-12-25 NOTE — Progress Notes (Signed)
[facility] ____________________________________________   None    (approximate)  I have reviewed the triage vital signs and the nursing notes.   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  Patient was no concerns or complaints.         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 Jenne Campus   TYMPANOSTOMY TUBE PLACEMENT     x3    Prior to Admission medications   Medication Sig Start Date End Date Taking? Authorizing Provider  methylphenidate (RITALIN LA) 40 MG 24 hr capsule Take 1 capsule (40 mg total) by mouth every morning. 12/10/22  Yes Malva Limes, MD    Allergies Amoxicillin-pot clavulanate and Ceftin  [cefuroxime axetil]  Family History  Problem Relation Age of Onset   Diabetes Mother    Stroke Mother    Coronary artery disease Mother    Colon polyps Mother    Bipolar disorder Mother    Kidney disease Mother    Diabetes Maternal Grandfather    Stroke Maternal Grandfather    Hypertension Maternal Grandfather    Colon cancer Maternal Grandfather    Coronary artery disease Maternal  Grandfather    Heart disease Maternal Grandfather    Heart attack Maternal Grandfather    Depression Maternal Grandmother    Bipolar disorder Maternal Grandmother     Social History Social History   Tobacco Use   Smoking status: Never   Smokeless tobacco: Never  Substance Use Topics   Alcohol use: No   Drug use: No    Review of Systems Constitutional: No fever/chills Eyes: No visual changes. ENT: No sore throat. Cardiovascular: Denies chest pain. Respiratory: Denies shortness of breath. Gastrointestinal: No abdominal pain.  No nausea, no vomiting.  No diarrhea.  No constipation. Genitourinary: Negative for dysuria. Musculoskeletal: Negative for back pain. Skin: Negative for rash. Neurological: Negative for headaches, focal weakness or numbness. Psychiatric: ADHD and anxiety Allergic/Immunilogical: Augmentin ____________________________________________   PHYSICAL EXAM:  VITAL SIGNS: BP is 146/87, pulse 65, respirations 14, temperature 98.2, patient 97% O2 sat on room air.  Patient weighs 179 pounds BMI is 30.73. Constitutional: Alert and oriented. Well appearing and in no acute distress. Eyes: Conjunctivae are normal. PERRL. EOMI. Head: Atraumatic. Nose: No congestion/rhinnorhea. Mouth/Throat: Mucous membranes are moist.  Oropharynx non-erythematous. Neck: No stridor.  No cervical spine tenderness to palpation. Hematological/Lymphatic/Immunilogical: No cervical lymphadenopathy. Cardiovascular: Normal rate, regular rhythm. Grossly normal heart sounds.  Good peripheral circulation. Respiratory: Normal respiratory effort.  No retractions. Lungs CTAB. Gastrointestinal: Soft and  nontender. No distention. No abdominal bruits. No CVA tenderness. Genitourinary: Deferred Musculoskeletal: No lower extremity tenderness nor edema.  No joint effusions. Neurologic:  Normal speech and language. No gross focal neurologic deficits are appreciated. No gait instability. Skin:  Skin is  warm, dry and intact. No rash noted. Psychiatric: Mood and affect are normal. Speech and behavior are normal.  ____________________________________________   LABS __  Ref Range & Units 3 d ago (12/22/22) 1 yr ago (11/04/21) 1 yr ago (05/16/21) 3 yr ago (04/12/19) 12 yr ago (03/03/10) 12 yr ago (03/03/10) 14 yr ago (02/06/08)   Color, UA  yellow yellow yellow      Clarity, UA  clear clear cleaer      Glucose, UA Negative Negative Negative Negative      Bilirubin, UA  nwg negative negative      Ketones, UA  neg negative negative      Spec Grav, UA 1.010 - 1.025 >=1.030 Abnormal  >=1.030 Abnormal  1.015      Blood, UA  neg negative negative      pH, UA 5.0 - 8.0 6.0 5.5 6.5      Protein, UA Negative Negative Negative Negative      Urobilinogen, UA 0.2 or 1.0 E.U./dL 0.2 0.2 0.2  1.0 R 1.0 R 0.2 R  Nitrite, UA  neg negative negative      Leukocytes, UA Negative Negative Negative Negative  NEGATIVE R    NEGATIVE Biochemical Testing Only. Please order routine urinalysis from main lab if confirmatory testing is needed. R      NEGATIVE Biochemical Testing Only. Please order routine urinalysis from main lab if confirmatory testing is needed. R    Appearance   medium medium CLEAR Abnormal  R CLOUDY Abnormal  R    Odor          Resulting Agency     [facility]         Specimen Collected: 12/22/22 09:44 Last Resulted: 12/22/22 09:44      Lab Flowsheet      Order Details      View Encounter      Lab and Collection Details      Routing      Result History    View All Conversations on this Encounter      R=Reference range differs from displayed range      Result Care Coordination   Patient Communication   Add Comments   Add Notifications  Back to Top      Other Results from 12/22/2022   Contains abnormal data CMP12+LP+TP+TSH+6AC+CBC/D/Plt Order: [id number] Status: Final result      Visible to patient: Yes (not seen)      Next appt:  None      Dx: Annual physical exam    0 Result Notes                 Component Ref Range & Units 3 d ago (12/22/22) 1 yr ago (11/04/21) 1 yr ago (05/24/21) 1 yr ago (05/16/21) 3 yr ago (04/12/19) 3 yr ago (04/12/19) 6 yr ago (06/11/16) 6 yr ago (06/11/16) 6 yr ago (06/11/16) 6 yr ago (06/11/16)  Glucose 70 - 99 mg/dL 94 97  83 R 79    96 R   Uric Acid 3.8 - 8.4 mg/dL 7.3 6.6 CM  6.6 CM        Comment:  Therapeutic target for gout patients: <6.0  BUN 6 - 20 mg/dL 19 13  13 18    16  R   Creatinine, Ser 0.76 - 1.27 mg/dL 2.35  3.61 4.43 R    0.82   eGFR >59 mL/min/1.73 107 102  96        BUN/Creatinine Ratio 9 - 20 19 12  12     20  R   Sodium 134 - 144 mmol/L 140 143  139 139 R    144   Potassium 3.5 - 5.2 mmol/L 4.7 4.7  4.4 3.7 R    4.4   Chloride 96 - 106 mmol/L 99 102  97 101 R    102   Calcium 8.7 - 10.2 mg/dL 9.9 1.54  9.5 R    9.7 R   Phosphorus 2.8 - 4.1 mg/dL 4.3 High  4.3 High   4.7 High         Total Protein 6.0 - 8.5 g/dL 7.6 7.4  7.6 8.1 R    7.2   Albumin 4.3 - 5.2 g/dL 5.1 5.0 R  5.4 High  R 5.1 High  R    5.0 R   Globulin, Total 1.5 - 4.5 g/dL 2.5 2.4  2.2     2.2   Albumin/Globulin Ratio 1.2 - 2.2 2.0 2.1  2.5 High      2.3 High    Bilirubin Total 0.0 - 1.2 mg/dL 0.3 0.3  0.4 0.6 R    00.8   Alkaline Phosphatase 44 - 121 IU/L 95 97 R  108 R 93 R    185 R   LDH 121 - 224 IU/L 145 154  210        AST 0 - 40 IU/L 19 24  25 28  R    22   ALT 0 - 44 IU/L 23 27  25 25  R    17 R   GGT 0 - 65 IU/L 21 19  17         Iron 38 - 169 ug/dL 72 70  74        Cholesterol, Total 100 - 199 mg/dL 67.6 High  <1.9    161 R     Triglycerides 0 - 149 mg/dL 509 High  326 712 High    134 High  R     HDL >39 mg/dL 32 Low  34 Low  31 Low  32 Low    35 Low      VLDL Cholesterol Cal 5 - 40 mg/dL 23 27 19  43 High    27     LDL Chol Calc (NIH) 0 - 99 mg/dL 458 High  099 High  833 High  112 High         Chol/HDL Ratio 0.0 - 5.0 ratio 6.3 High  5.3 High  CM 4.9 CM 5.8  High  CM   4.6 R, CM     Comment:                                   T. Chol/HDL Ratio                                             Men  Women  1/2 Avg.Risk  3.4    3.3                                   Avg.Risk  5.0    4.4                                2X Avg.Risk  9.6    7.1                                3X Avg.Risk 23.4   11.0  Estimated CHD Risk 0.0 - 1.0 times avg. 1.3 High  1.1 High  CM  1.2 High  CM        Comment: The CHD Risk is based on the T. Chol/HDL ratio. Other factors affect CHD Risk such as hypertension, smoking, diabetes, severe obesity, and family history of premature CHD.  TSH 0.450 - 4.500 uIU/mL 2.810 3.130  3.160    3.890    T4, Total 4.5 - 12.0 ug/dL 6.9 7.0  7.5        T3 Uptake Ratio 24 - 39 % 23 Low  23 Low   24        Free Thyroxine Index 1.2 - 4.9 1.6 1.6  1.8        WBC 3.4 - 10.8 x10E3/uL 7.1 6.4  7.0  12.0 High  R    6.4  RBC 4.14 - 5.80 x10E6/uL 6.26 High  6.52 High   6.47 High   6.29 High  R    5.97 High   Hemoglobin 13.0 - 17.7 g/dL 15.5 16.4  16.4  16.0 R    15.0 R  Hematocrit 37.5 - 51.0 % 48.7 49.4  49.4  49.0 R    46.5  MCV 79 - 97 fL 78 Low  76 Low   76 Low   77.9 Low  R    78 Low   MCH 26.6 - 33.0 pg 24.8 Low  25.2 Low   25.3 Low   25.4 Low  R    25.1 Low   MCHC 31.5 - 35.7 g/dL 31.8 33.2  33.2  32.7 R    32.3  RDW 11.6 - 15.4 % 15.1 13.4  13.7  13.5 R    14.8 R  Platelets 150 - 450 x10E3/uL 294 290  267  292 R    275 R  Neutrophils Not Estab. % 52 52  55  73 R      Lymphs Not Estab. % 36 34  33        Monocytes Not Estab. % 8 8  7         Eos Not Estab. % 3 4  3         Basos Not Estab. % 1 1  1         Neutrophils Absolute 1.4 - 7.0 x10E3/uL 3.6 3.3  3.8  8.7 High  R      Lymphocytes Absolute 0.7 - 3.1 x10E3/uL 2.6 2.2  2.3  2.4 R      Monocytes Absolute 0.1 - 0.9 x10E3/uL 0.6 0.5  0.5        EOS (ABSOLUTE) 0.0 - 0.4 x10E3/uL 0.2 0.3  0.2        Basophils Absolute 0.0 - 0.2 x10E3/uL 0.1 0.1  0.1  0.0 R       Immature Granulocytes Not Estab. % 0 1  1  0 R                       __________________________________________  EKG  Bradycardic at 51 bpm ____________________________________________  RADIOLOGY   ____________________________________________   INITIAL IMPRESSION / ASSESSMENT AND PLAN As part of my medical decision making, I reviewed the following data within the electronic MEDICAL RECORD NUMBER         Discussed physical exam EKG, lab results with patient.  Recommend 3-day blood pressure check.  ____________________________________________   FINAL CLINICAL IMPRESSION(  Well exam   ED Discharge Orders     None        Note:  This document was prepared using Dragon voice recognition software and may include unintentional dictation errors.

## 2022-12-25 NOTE — Progress Notes (Signed)
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  No c/o voiced.

## 2023-01-09 ENCOUNTER — Other Ambulatory Visit: Payer: Self-pay | Admitting: Family Medicine

## 2023-01-09 DIAGNOSIS — F902 Attention-deficit hyperactivity disorder, combined type: Secondary | ICD-10-CM

## 2023-01-10 MED ORDER — METHYLPHENIDATE HCL ER (LA) 40 MG PO CP24: 40.0000 mg | ORAL_CAPSULE | ORAL | 0 refills | Status: DC

## 2023-01-12 ENCOUNTER — Telehealth: Payer: Self-pay | Admitting: Family Medicine

## 2023-01-12 DIAGNOSIS — F902 Attention-deficit hyperactivity disorder, combined type: Secondary | ICD-10-CM

## 2023-01-12 MED ORDER — METHYLPHENIDATE HCL ER (LA) 40 MG PO CP24: 40.0000 mg | ORAL_CAPSULE | ORAL | 0 refills | Status: DC

## 2023-01-12 NOTE — Telephone Encounter (Signed)
This pt is stating found med  methylphenidate (RITALIN LA) 40 MG 24 hr  send to [place], [employer] AT [address] Phone: [phone]  Fax: [phone]

## 2023-01-12 NOTE — Telephone Encounter (Signed)
The patient called in stating he has contacted multiple pharmacies and none of them have his methylphenidate (RITALIN LA) 40 MG 24 hr capsule . Is there something else he can get that is comparable to this medication that would be in stock and that his insurance would cover. He uses  [id number]- [place] - [address] Phone: [phone] Fax: [phone]    The patient ran out of his medication this weekend and really needs it especially for work. Please assist patient further

## 2023-02-05 ENCOUNTER — Other Ambulatory Visit: Payer: Self-pay | Admitting: Family Medicine

## 2023-02-05 DIAGNOSIS — F902 Attention-deficit hyperactivity disorder, combined type: Secondary | ICD-10-CM

## 2023-02-07 MED ORDER — METHYLPHENIDATE HCL ER (LA) 40 MG PO CP24: 40.0000 mg | ORAL_CAPSULE | ORAL | 0 refills | Status: DC

## 2023-03-10 ENCOUNTER — Other Ambulatory Visit: Payer: Self-pay | Admitting: Family Medicine

## 2023-03-10 DIAGNOSIS — F902 Attention-deficit hyperactivity disorder, combined type: Secondary | ICD-10-CM

## 2023-03-10 MED ORDER — METHYLPHENIDATE HCL ER (LA) 40 MG PO CP24: 40.0000 mg | ORAL_CAPSULE | ORAL | 0 refills | Status: DC

## 2023-04-07 ENCOUNTER — Other Ambulatory Visit: Payer: Self-pay | Admitting: Family Medicine

## 2023-04-07 DIAGNOSIS — F902 Attention-deficit hyperactivity disorder, combined type: Secondary | ICD-10-CM

## 2023-04-07 MED ORDER — METHYLPHENIDATE HCL ER (LA) 40 MG PO CP24
40.0000 mg | ORAL_CAPSULE | ORAL | 0 refills | Status: DC
Start: 2023-04-07 — End: 2023-05-06

## 2023-05-06 ENCOUNTER — Other Ambulatory Visit: Payer: Self-pay | Admitting: Family Medicine

## 2023-05-06 DIAGNOSIS — F902 Attention-deficit hyperactivity disorder, combined type: Secondary | ICD-10-CM

## 2023-05-06 MED ORDER — METHYLPHENIDATE HCL ER (LA) 40 MG PO CP24: 40.0000 mg | ORAL_CAPSULE | ORAL | 0 refills | Status: DC

## 2023-06-08 ENCOUNTER — Other Ambulatory Visit: Payer: Self-pay | Admitting: Family Medicine

## 2023-06-08 DIAGNOSIS — F902 Attention-deficit hyperactivity disorder, combined type: Secondary | ICD-10-CM

## 2023-06-09 MED ORDER — METHYLPHENIDATE HCL ER (LA) 40 MG PO CP24: 40.0000 mg | ORAL_CAPSULE | ORAL | 0 refills | Status: DC

## 2023-07-07 ENCOUNTER — Other Ambulatory Visit: Payer: Self-pay | Admitting: Family Medicine

## 2023-07-07 DIAGNOSIS — F902 Attention-deficit hyperactivity disorder, combined type: Secondary | ICD-10-CM

## 2023-07-07 MED ORDER — METHYLPHENIDATE HCL ER (LA) 40 MG PO CP24
40.0000 mg | ORAL_CAPSULE | ORAL | 0 refills | Status: DC
Start: 2023-07-07 — End: 2023-08-04

## 2023-08-04 ENCOUNTER — Other Ambulatory Visit: Payer: Self-pay | Admitting: Family Medicine

## 2023-08-04 DIAGNOSIS — F902 Attention-deficit hyperactivity disorder, combined type: Secondary | ICD-10-CM

## 2023-08-05 MED ORDER — METHYLPHENIDATE HCL ER (LA) 40 MG PO CP24
40.0000 mg | ORAL_CAPSULE | ORAL | 0 refills | Status: DC
Start: 2023-08-05 — End: 2023-09-04

## 2023-09-04 ENCOUNTER — Other Ambulatory Visit: Payer: Self-pay | Admitting: Family Medicine

## 2023-09-04 DIAGNOSIS — F902 Attention-deficit hyperactivity disorder, combined type: Secondary | ICD-10-CM

## 2023-09-04 MED ORDER — METHYLPHENIDATE HCL ER (LA) 40 MG PO CP24: 40.0000 mg | ORAL_CAPSULE | ORAL | 0 refills | Status: DC

## 2023-10-06 ENCOUNTER — Other Ambulatory Visit: Payer: Self-pay | Admitting: Family Medicine

## 2023-10-06 DIAGNOSIS — F902 Attention-deficit hyperactivity disorder, combined type: Secondary | ICD-10-CM

## 2023-10-06 MED ORDER — METHYLPHENIDATE HCL ER (LA) 40 MG PO CP24: 40.0000 mg | ORAL_CAPSULE | ORAL | 0 refills | Status: DC

## 2023-10-26 ENCOUNTER — Other Ambulatory Visit: Payer: Self-pay

## 2023-10-26 NOTE — Progress Notes (Signed)
Random UDS and BAT completed for [facility] after consents signed.

## 2023-11-04 ENCOUNTER — Other Ambulatory Visit: Payer: Self-pay | Admitting: Family Medicine

## 2023-11-04 DIAGNOSIS — F902 Attention-deficit hyperactivity disorder, combined type: Secondary | ICD-10-CM

## 2023-11-04 MED ORDER — METHYLPHENIDATE HCL ER (LA) 40 MG PO CP24: 40.0000 mg | ORAL_CAPSULE | ORAL | 0 refills | Status: DC

## 2023-11-11 ENCOUNTER — Ambulatory Visit: Payer: Self-pay

## 2023-11-11 ENCOUNTER — Encounter: Payer: Self-pay | Admitting: Physician Assistant

## 2023-11-11 DIAGNOSIS — Z0000 Encounter for general adult medical examination without abnormal findings: Secondary | ICD-10-CM

## 2023-11-11 LAB — POCT URINALYSIS DIPSTICK
Bilirubin, UA: NEGATIVE
Blood, UA: NEGATIVE
Glucose, UA: NEGATIVE
Ketones, UA: NEGATIVE
Leukocytes, UA: NEGATIVE
Nitrite, UA: NEGATIVE
Protein, UA: NEGATIVE
Spec Grav, UA: 1.015 (ref 1.010–1.025)
Urobilinogen, UA: 0.2 U/dL
pH, UA: 6.5 (ref 5.0–8.0)

## 2023-11-11 NOTE — Progress Notes (Signed)
Pt completed labs for FF physical./CL,RMA 

## 2023-11-12 LAB — CMP12+LP+TP+TSH+6AC+CBC/D/PLT
ALT: 24 [IU]/L (ref 0–44)
AST: 21 [IU]/L (ref 0–40)
Albumin: 5.1 g/dL (ref 4.3–5.2)
Alkaline Phosphatase: 87 [IU]/L (ref 44–121)
BUN/Creatinine Ratio: 19 (ref 9–20)
BUN: 19 mg/dL (ref 6–20)
Basophils Absolute: 0 10*3/uL (ref 0.0–0.2)
Basos: 1 %
Bilirubin Total: 0.4 mg/dL (ref 0.0–1.2)
Calcium: 9.7 mg/dL (ref 8.7–10.2)
Chloride: 101 mmol/L (ref 96–106)
Chol/HDL Ratio: 6.1 {ratio} — ABNORMAL HIGH (ref 0.0–5.0)
Cholesterol, Total: 200 mg/dL — ABNORMAL HIGH (ref 100–199)
Creatinine, Ser: 1.01 mg/dL (ref 0.76–1.27)
EOS (ABSOLUTE): 0.1 10*3/uL (ref 0.0–0.4)
Eos: 3 %
Estimated CHD Risk: 1.3 times avg. — ABNORMAL HIGH (ref 0.0–1.0)
Free Thyroxine Index: 1.9 (ref 1.2–4.9)
GGT: 21 [IU]/L (ref 0–65)
Globulin, Total: 2.2 g/dL (ref 1.5–4.5)
Glucose: 95 mg/dL (ref 70–99)
HDL: 33 mg/dL — ABNORMAL LOW (ref >39)
Hematocrit: 50.4 % (ref 37.5–51.0)
Hemoglobin: 16.2 g/dL (ref 13.0–17.7)
Immature Grans (Abs): 0 10*3/uL (ref 0.0–0.1)
Immature Granulocytes: 0 %
Iron: 72 ug/dL (ref 38–169)
LDH: 157 [IU]/L (ref 121–224)
LDL Chol Calc (NIH): 140 mg/dL — ABNORMAL HIGH (ref 0–99)
Lymphocytes Absolute: 1.8 10*3/uL (ref 0.7–3.1)
Lymphs: 32 %
MCH: 25.2 pg — ABNORMAL LOW (ref 26.6–33.0)
MCHC: 32.1 g/dL (ref 31.5–35.7)
MCV: 78 fL — ABNORMAL LOW (ref 79–97)
Monocytes Absolute: 0.4 10*3/uL (ref 0.1–0.9)
Monocytes: 8 %
Neutrophils Absolute: 3.2 10*3/uL (ref 1.4–7.0)
Neutrophils: 56 %
Phosphorus: 3.4 mg/dL (ref 2.8–4.1)
Platelets: 274 10*3/uL (ref 150–450)
Potassium: 4.4 mmol/L (ref 3.5–5.2)
RBC: 6.43 x10E6/uL — ABNORMAL HIGH (ref 4.14–5.80)
RDW: 14.5 % (ref 11.6–15.4)
Sodium: 140 mmol/L (ref 134–144)
T3 Uptake Ratio: 25 % (ref 24–39)
T4, Total: 7.7 ug/dL (ref 4.5–12.0)
TSH: 2.34 u[IU]/mL (ref 0.450–4.500)
Total Protein: 7.3 g/dL (ref 6.0–8.5)
Triglycerides: 150 mg/dL — ABNORMAL HIGH (ref 0–149)
Uric Acid: 7.7 mg/dL (ref 3.8–8.4)
VLDL Cholesterol Cal: 27 mg/dL (ref 5–40)
WBC: 5.7 10*3/uL (ref 3.4–10.8)
eGFR: 107 mL/min/{1.73_m2} (ref >59)

## 2023-11-17 ENCOUNTER — Ambulatory Visit: Payer: Self-pay | Admitting: Physician Assistant

## 2023-11-17 ENCOUNTER — Encounter: Payer: Self-pay | Admitting: Physician Assistant

## 2023-11-17 VITALS — BP 134/57 | HR 57 | Temp 97.7°F | Resp 16 | Ht 64.0 in | Wt 178.0 lb

## 2023-11-17 DIAGNOSIS — Z0289 Encounter for other administrative examinations: Secondary | ICD-10-CM

## 2023-11-17 NOTE — Progress Notes (Signed)
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 Joshua Green

## 2023-11-17 NOTE — Progress Notes (Signed)
[facility] ____________________________________________   None    (approximate)  I have reviewed the triage vital signs and the nursing notes.   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  Patient was no concerning complaints.         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 Jenne Campus   TYMPANOSTOMY TUBE PLACEMENT     x3    Prior to Admission medications   Medication Sig Start Date End Date Taking? Authorizing Provider  methylphenidate (RITALIN LA) 40 MG 24 hr capsule Take 1 capsule (40 mg total) by mouth every morning. 11/04/23   Malva Limes, MD    Allergies Amoxicillin-pot clavulanate, Ceftin  [cefuroxime axetil], and Cefuroxime axetil  Family History  Problem Relation Age of Onset   Diabetes Mother    Stroke Mother    Coronary artery disease Mother    Colon polyps Mother    Bipolar disorder Mother    Kidney disease Mother    Diabetes Maternal Grandfather    Stroke Maternal Grandfather    Hypertension Maternal Grandfather    Colon cancer Maternal Grandfather     Coronary artery disease Maternal Grandfather    Heart disease Maternal Grandfather    Heart attack Maternal Grandfather    Depression Maternal Grandmother    Bipolar disorder Maternal Grandmother     Social History Social History   Tobacco Use   Smoking status: Never   Smokeless tobacco: Never  Substance Use Topics   Alcohol use: No   Drug use: No    Review of Systems Constitutional: No fever/chills Eyes: No visual changes. ENT: No sore throat. Cardiovascular: Denies chest pain. Respiratory: Denies shortness of breath. Gastrointestinal: No abdominal pain.  No nausea, no vomiting.  No diarrhea.  No constipation. Genitourinary: Negative for dysuria. Musculoskeletal: Negative for back pain. Skin: Negative for rash. Neurological: Negative for headaches, focal weakness or numbness. Psychiatric: ADHD and anxiety Allergic/Immunilogical: Augmentin and cephalosporins  ____________________________________________   PHYSICAL EXAM:  VITAL SIGNS: BP 134/57BP. 134/57. Data is abnormal. Taken on 11/17/23 8:23 AM  Cuff Size Large  Pulse Rate 57Pulse Rate. 57. Data is abnormal. Taken on 11/17/23 8:23 AM  Temp 97.7 F (36.5 C)  Temp Source Temporal  Weight 178 lb (80.7 kg)  Height 5\' 4"  (1.626 m)  Resp 16   BMI: 30.55 kg/m2  BSA: 1.91 m2   Constitutional: Alert and oriented. Well appearing and in no acute distress. Eyes: Conjunctivae are normal. PERRL. EOMI. Head: Atraumatic. Nose: No congestion/rhinnorhea. Mouth/Throat:  Mucous membranes are moist.  Oropharynx non-erythematous. Neck: No stridor.  No cervical spine tenderness to palpation. Hematological/Lymphatic/Immunilogical: No cervical lymphadenopathy. Cardiovascular: Bradycardia, regular rhythm. Grossly normal heart sounds.  Good peripheral circulation. Respiratory: Normal respiratory effort.  No retractions. Lungs CTAB. Gastrointestinal: Soft and nontender. No distention. No abdominal bruits. No CVA  tenderness. Genitourinary: Deferred Musculoskeletal: No lower extremity tenderness nor edema.  No joint effusions. Neurologic:  Normal speech and language. No gross focal neurologic deficits are appreciated. No gait instability. Skin:  Skin is warm, dry and intact. No rash noted. Psychiatric: Mood and affect are normal. Speech and behavior are normal.  ____________________________________________   LABS           Component Ref Range & Units (hover) 6 d ago (11/11/23) 11 mo ago (12/22/22) 2 yr ago (11/04/21) 2 yr ago (05/16/21) 4 yr ago (04/12/19) 13 yr ago (03/03/10) 13 yr ago (03/03/10)  Color, UA Yellow yellow yellow yellow     Clarity, UA Clear clear clear cleaer     Glucose, UA Negative Negative Negative Negative     Bilirubin, UA Negative nwg negative negative     Ketones, UA Negative neg negative negative     Spec Grav, UA 1.015 >=1.030 Abnormal  >=1.030 Abnormal  1.015     Blood, UA Negative neg negative negative     pH, UA 6.5 6.0 5.5 6.5     Protein, UA Negative Negative Negative Negative     Urobilinogen, UA 0.2 0.2 0.2 0.2  1.0 R 1.0 R  Nitrite, UA Negative neg negative negative     Leukocytes, UA Negative Negative Negative Negative  NEGATIVE R    NEGATIVE Biochemical Testing Only. Please order routine urinalysis from main lab if confirmatory testing is needed. R    Appearance   medium medium CLEAR Abnormal  R CLOUDY Abnormal  R   Odor         Resulting Agency     [facility]             View All Conversations on this Encounter               Component Ref Range & Units (hover) 6 d ago (11/11/23) 11 mo ago (12/22/22) 2 yr ago (11/04/21) 2 yr ago (05/24/21) 2 yr ago (05/16/21) 4 yr ago (04/12/19) 4 yr ago (04/12/19)  Glucose 95 94 97  83 R 79   Uric Acid 7.7 7.3 CM 6.6 CM  6.6 CM    Comment:            Therapeutic target for gout patients: <6.0  BUN 19 19 13  13 18    Creatinine, Ser 1.01 1.02 1.07  1.12 0.79 R   eGFR 107 107 102  96     BUN/Creatinine Ratio 19 19 12  12     Sodium 140 140 143  139 139 R   Potassium 4.4 4.7 4.7  4.4 3.7 R   Chloride 101 99 102  97 101 R   Calcium 9.7 9.9 10.1  10.0 9.5 R   Phosphorus 3.4 4.3 High  4.3 High   4.7 High     Total Protein 7.3 7.6 7.4  7.6 8.1 R   Albumin 5.1 5.1 5.0 R  5.4 High  R 5.1 High  R   Globulin, Total 2.2 2.5 2.4  2.2    Bilirubin Total 0.4 0.3 0.3  0.4 0.6 R   Alkaline Phosphatase 87 95  97 R  108 R 93 R   LDH 157 145 154  210    AST 21 19 24  25 28  R   ALT 24 23 27  25 25  R   GGT 21 21 19  17     Iron 72 72 70  74    Cholesterol, Total 200 High  201 High  181 151 187    Triglycerides 150 High  128 150 High  100 247 High     HDL 33 Low  32 Low  34 Low  31 Low  32 Low     VLDL Cholesterol Cal 27 23 27 19  43 High     LDL Chol Calc (NIH) 140 High  146 High  120 High  101 High  112 High     Chol/HDL Ratio 6.1 High  6.3 High  CM 5.3 High  CM 4.9 CM 5.8 High  CM    Comment:                                   T. Chol/HDL Ratio                                             Men  Women                               1/2 Avg.Risk  3.4    3.3                                   Avg.Risk  5.0    4.4                                2X Avg.Risk  9.6    7.1                                3X Avg.Risk 23.4   11.0  Estimated CHD Risk 1.3 High  1.3 High  CM 1.1 High  CM  1.2 High  CM    Comment: The CHD Risk is based on the T. Chol/HDL ratio. Other factors affect CHD Risk such as hypertension, smoking, diabetes, severe obesity, and family history of premature CHD.  TSH 2.340 2.810 3.130  3.160    T4, Total 7.7 6.9 7.0  7.5    T3 Uptake Ratio 25 23 Low  23 Low   24    Free Thyroxine Index 1.9 1.6 1.6  1.8    WBC 5.7 7.1 6.4  7.0  12.0 High  R  RBC 6.43 High  6.26 High  6.52 High   6.47 High   6.29 High  R  Hemoglobin 16.2 15.5 16.4  16.4  16.0 R  Hematocrit 50.4 48.7 49.4  49.4  49.0 R  MCV 78 Low  78 Low  76 Low   76 Low   77.9 Low  R  MCH 25.2 Low  24.8 Low  25.2 Low   25.3 Low    25.4 Low  R  MCHC 32.1 31.8 33.2  33.2  32.7  R  RDW 14.5 15.1 13.4  13.7  13.5 R  Platelets 274 294 290  267  292 R  Neutrophils 56 52 52  55  73 R  Lymphs 32 36 34  33    Monocytes 8 8 8  7     Eos 3 3 4  3     Basos 1 1 1  1     Neutrophils Absolute 3.2 3.6 3.3  3.8  8.7 High  R  Lymphocytes Absolute 1.8 2.6 2.2  2.3  2.4 R  Monocytes Absolute 0.4 0.6 0.5  0.5    EOS (ABSOLUTE) 0.1 0.2 0.3  0.2    Basophils Absolute 0.0 0.1 0.1  0.1  0.0 R  Immature Granulocytes 0 0 1  1  0 R  Immature Grans (Abs) 0.0 0.0 0.0  0.1               ____________________________________________  EKG  Normal sinus rhythm at 67 bpm ____________________________________________    ____________________________________________   INITIAL IMPRESSION / ASSESSMENT AND PLAN  As part of my medical decision making, I reviewed the following data within the electronic MEDICAL RECORD NUMBER      No acute findings on physical exam, EKG, labs.        ____________________________________________   FINAL CLINICAL IMPRESSION Well exam   ED Discharge Orders     None        Note:  This document was prepared using Dragon voice recognition software and may include unintentional dictation errors.

## 2023-12-04 ENCOUNTER — Other Ambulatory Visit: Payer: Self-pay | Admitting: Family Medicine

## 2023-12-04 DIAGNOSIS — F902 Attention-deficit hyperactivity disorder, combined type: Secondary | ICD-10-CM

## 2023-12-06 MED ORDER — METHYLPHENIDATE HCL ER (LA) 40 MG PO CP24: 40.0000 mg | ORAL_CAPSULE | ORAL | 0 refills | Status: DC

## 2023-12-07 ENCOUNTER — Telehealth: Payer: Self-pay | Admitting: Family Medicine

## 2023-12-07 ENCOUNTER — Other Ambulatory Visit: Payer: Self-pay

## 2023-12-07 DIAGNOSIS — F902 Attention-deficit hyperactivity disorder, combined type: Secondary | ICD-10-CM

## 2023-12-07 NOTE — Telephone Encounter (Signed)
 Medication Refill -  Most Recent Primary Care Visit:  Provider: GASPER NANCYANN BRAVO  Department: [facility]  Visit Type: OFFICE VISIT  Date: 12/16/2022  Medication:  methylphenidate  (RITALIN  LA) 40 MG 24 hr capsule  Has the patient contacted their pharmacy? No Pt needed appt.  Pt made appt, but would like this refilled today b/c he needs for work. Is this the correct pharmacy for this prescription? No If no, delete pharmacy and type the correct one.  This is the patient's preferred pharmacy:   [facility] [address] [facility] - [place], [place] - [address] AT [address] [place] [address] Phone: [phone] Fax: [phone]   Has the prescription been filled recently? No  Is the patient out of the medication? Yes  Has the patient been seen for an appointment in the last year OR does the patient have an upcoming appointment? Yes  Can we respond through MyChart? Yes  Agent: Please be advised that Rx refills may take up to 3 business days. We ask that you follow-up with your pharmacy.

## 2023-12-09 ENCOUNTER — Encounter: Payer: Self-pay | Admitting: Family Medicine

## 2023-12-09 ENCOUNTER — Ambulatory Visit (INDEPENDENT_AMBULATORY_CARE_PROVIDER_SITE_OTHER): Payer: 59 | Admitting: Family Medicine

## 2023-12-09 VITALS — BP 136/83 | HR 74 | Resp 16 | Ht 64.0 in | Wt 186.6 lb

## 2023-12-09 DIAGNOSIS — J45909 Unspecified asthma, uncomplicated: Secondary | ICD-10-CM

## 2023-12-09 DIAGNOSIS — E782 Mixed hyperlipidemia: Secondary | ICD-10-CM | POA: Insufficient documentation

## 2023-12-09 DIAGNOSIS — F902 Attention-deficit hyperactivity disorder, combined type: Secondary | ICD-10-CM

## 2023-12-09 DIAGNOSIS — Z8249 Family history of ischemic heart disease and other diseases of the circulatory system: Secondary | ICD-10-CM | POA: Diagnosis not present

## 2023-12-09 NOTE — Progress Notes (Signed)
      Established patient visit   Patient: Joshua Green   DOB: 05/30/2001   23 y.o. Male  MRN: [id number] Visit Date: 12/09/2023  Today's healthcare provider: Nancyann Perry, MD   Chief Complaint  Patient presents with   Medical Management of Chronic Issues    ADHD follow-up   Subjective    Discussed the use of AI scribe software for clinical note transcription with the patient, who gave verbal consent to proceed.  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 He has been taking Ritalin  LA daily, which he reports as beneficial. Without it, he experiences increased talkativeness and feels scattered, as reported by his SO. He denies any trouble sleeping or resting and reports a good appetite. He has not experienced any heart flutters or chest pains.  His BP and recent cholesterol levels are noted to be elevated.  He admits to a diet high in meat, particularly red meat and pork, which could be contributing to his cholesterol levels. He denies any numbness or tingling in his hands, fingers, or ankles.  Lab Results  Component Value Date   CHOL 200 (H) 11/11/2023   HDL 33 (L) 11/11/2023   LDLCALC 140 (H) 11/11/2023   TRIG 150 (H) 11/11/2023   CHOLHDL 6.1 (H) 11/11/2023      Medications: Outpatient Medications Prior to Visit  Medication Sig   methylphenidate  (RITALIN  LA) 40 MG 24 hr capsule Take 1 capsule (40 mg total) by mouth every morning.   No facility-administered medications prior to visit.   Review of Systems     Objective    BP 136/83 (BP Location: Right Arm, Patient Position: Sitting, Cuff Size: Normal)   Pulse 74   Resp 16   Ht 5' 4 (1.626 m)   Wt 186 lb 9.6 oz (84.6 kg)   BMI 32.03 kg/m   Physical Exam   General: Appearance:    Mildly obese male in no acute distress  Eyes:    PERRL, conjunctiva/corneas clear, EOM's intact       Lungs:     Clear to auscultation bilaterally, respirations unlabored  Heart:     Normal heart rate. Normal rhythm. No murmurs, rubs, or gallops.    MS:   All extremities are intact.    Neurologic:   Awake, alert, oriented x 3. No apparent focal neurological defect.        Assessment & Plan     Attention Deficit Hyperactivity Disorder (ADHD) Stable on current medications. Reports difficulty focusing and increased talkativeness when not taking medication. No reported sleep disturbances or cardiac symptoms. -Continue current dose of Ritalin  LA as prescribed.  Hyperlipidemia Elevated cholesterol noted on recent blood work. Patient reports high meat intake in diet. -Advised to reduce intake of red meats and pork, and increase intake of seafood and chicken to help manage cholesterol levels. -Plan to monitor cholesterol levels annually.  Consider family history of premature CVD and CAD will consider statin if lipids do not improve over the next few years           Nancyann Perry, MD  [facility] [phone] (phone) [phone] (fax)  [facility]

## 2024-01-05 ENCOUNTER — Other Ambulatory Visit: Payer: Self-pay | Admitting: Family Medicine

## 2024-01-05 DIAGNOSIS — F902 Attention-deficit hyperactivity disorder, combined type: Secondary | ICD-10-CM

## 2024-01-06 MED ORDER — METHYLPHENIDATE HCL ER (LA) 40 MG PO CP24: 40.0000 mg | ORAL_CAPSULE | ORAL | 0 refills | Status: DC

## 2024-02-04 ENCOUNTER — Other Ambulatory Visit: Payer: Self-pay | Admitting: Family Medicine

## 2024-02-04 DIAGNOSIS — F902 Attention-deficit hyperactivity disorder, combined type: Secondary | ICD-10-CM

## 2024-02-04 MED ORDER — METHYLPHENIDATE HCL ER (LA) 40 MG PO CP24: 40.0000 mg | ORAL_CAPSULE | ORAL | 0 refills | Status: DC

## 2024-02-04 NOTE — Telephone Encounter (Signed)
 This was a courtesy refill. Pt needs to schedule an appointment before his next refill

## 2024-03-04 ENCOUNTER — Other Ambulatory Visit: Payer: Self-pay | Admitting: Physician Assistant

## 2024-03-04 DIAGNOSIS — F902 Attention-deficit hyperactivity disorder, combined type: Secondary | ICD-10-CM

## 2024-03-05 ENCOUNTER — Other Ambulatory Visit: Payer: Self-pay | Admitting: Physician Assistant

## 2024-03-05 DIAGNOSIS — F902 Attention-deficit hyperactivity disorder, combined type: Secondary | ICD-10-CM

## 2024-03-07 ENCOUNTER — Encounter: Payer: Self-pay | Admitting: Family Medicine

## 2024-03-07 ENCOUNTER — Other Ambulatory Visit: Payer: Self-pay | Admitting: Physician Assistant

## 2024-03-07 DIAGNOSIS — F902 Attention-deficit hyperactivity disorder, combined type: Secondary | ICD-10-CM

## 2024-03-07 NOTE — Telephone Encounter (Signed)
 Requested medication (s) are due for refill today: yes  Requested medication (s) are on the active medication list: yes  Last refill:  02/04/24  Future visit scheduled: no  Notes to clinic:  Unable to refill per protocol, cannot delegate.      Requested Prescriptions  Pending Prescriptions Disp Refills   methylphenidate (RITALIN LA) 40 MG 24 hr capsule [Pharmacy Med Name: METHYLPHENIDATE ER(LA) 40MG  CP] 30 capsule 0    Sig: TAKE ONE CAPSULE BY MOUTH EVERY MORNING **NEED TO SCHEDULE OFFICE VISIT FOR FOLLOW UP**     Not Delegated - Psychiatry:  Stimulants/ADHD Failed - 03/07/2024  3:16 PM      Failed - This refill cannot be delegated      Failed - Urine Drug Screen completed in last 360 days      Failed - Valid encounter within last 6 months    Recent Outpatient Visits   None            Passed - Last BP in normal range    BP Readings from Last 1 Encounters:  12/09/23 136/83         Passed - Last Heart Rate in normal range    Pulse Readings from Last 1 Encounters:  12/09/23 74

## 2024-03-08 ENCOUNTER — Telehealth: Payer: Self-pay

## 2024-03-08 MED ORDER — METHYLPHENIDATE HCL ER (LA) 40 MG PO CP24: 40.0000 mg | ORAL_CAPSULE | ORAL | 0 refills | Status: DC

## 2024-03-08 NOTE — Telephone Encounter (Signed)
 Duplicate Encounter

## 2024-03-08 NOTE — Telephone Encounter (Signed)
 Copied from CRM [id number]. Topic: Clinical - Medication Question >> Mar 07, 2024 12:24 PM Yolanda T wrote: Reason for CRM: Patient called stated he took his last methylphenidate (RITALIN LA) 40 MG 24 hr capsule today and said he really needs his script filled. Please f/u with patient

## 2024-03-31 ENCOUNTER — Other Ambulatory Visit: Payer: Self-pay | Admitting: Family Medicine

## 2024-03-31 DIAGNOSIS — L728 Other follicular cysts of the skin and subcutaneous tissue: Secondary | ICD-10-CM | POA: Diagnosis not present

## 2024-03-31 DIAGNOSIS — F902 Attention-deficit hyperactivity disorder, combined type: Secondary | ICD-10-CM

## 2024-03-31 DIAGNOSIS — D485 Neoplasm of uncertain behavior of skin: Secondary | ICD-10-CM | POA: Diagnosis not present

## 2024-03-31 DIAGNOSIS — L731 Pseudofolliculitis barbae: Secondary | ICD-10-CM | POA: Diagnosis not present

## 2024-04-01 MED ORDER — METHYLPHENIDATE HCL ER (LA) 40 MG PO CP24: 40.0000 mg | ORAL_CAPSULE | ORAL | 0 refills | Status: DC

## 2024-05-03 ENCOUNTER — Other Ambulatory Visit: Payer: Self-pay | Admitting: Family Medicine

## 2024-05-03 DIAGNOSIS — F902 Attention-deficit hyperactivity disorder, combined type: Secondary | ICD-10-CM

## 2024-05-03 NOTE — Telephone Encounter (Unsigned)
 Copied from CRM [id number]. Topic: Clinical - Medication Refill >> May 03, 2024  9:41 AM Zipporah Him wrote: Medication: methylphenidate  (RITALIN  LA) 40 MG 24 hr capsule  Has the patient contacted their pharmacy? Yes  This is the patient's preferred pharmacy:  [facility] [address] [facility] - [place], [place] - [address] [facility] AT [facility] [address] [place] [address] Phone: [phone] Fax: [phone]  Is this the correct pharmacy for this prescription? Yes If no, delete pharmacy and type the correct one.   Has the prescription been filled recently? No  Is the patient out of the medication? No, enough until friday  Has the patient been seen for an appointment in the last year OR does the patient have an upcoming appointment? Yes  Can we respond through MyChart? Yes  Agent: Please be advised that Rx refills may take up to 3 business days. We ask that you follow-up with your pharmacy.

## 2024-05-04 MED ORDER — METHYLPHENIDATE HCL ER (LA) 40 MG PO CP24: 40.0000 mg | ORAL_CAPSULE | ORAL | 0 refills | Status: DC

## 2024-05-04 NOTE — Telephone Encounter (Signed)
 Requested medication (s) are due for refill today: yes  Requested medication (s) are on the active medication list: yes  Last refill:  04/01/24  Future visit scheduled: no  Notes to clinic:  Unable to refill per protocol, cannot delegate.      Requested Prescriptions  Pending Prescriptions Disp Refills   methylphenidate  (RITALIN  LA) 40 MG 24 hr capsule 30 capsule 0    Sig: Take 1 capsule (40 mg total) by mouth every morning.     Not Delegated - Psychiatry:  Stimulants/ADHD Failed - 05/04/2024  1:19 PM      Failed - This refill cannot be delegated      Failed - Urine Drug Screen completed in last 360 days      Failed - Valid encounter within last 6 months    Recent Outpatient Visits   None            Passed - Last BP in normal range    BP Readings from Last 1 Encounters:  12/09/23 136/83         Passed - Last Heart Rate in normal range    Pulse Readings from Last 1 Encounters:  12/09/23 74

## 2024-06-01 ENCOUNTER — Telehealth: Payer: Self-pay | Admitting: Family Medicine

## 2024-06-01 DIAGNOSIS — F902 Attention-deficit hyperactivity disorder, combined type: Secondary | ICD-10-CM

## 2024-06-01 NOTE — Telephone Encounter (Signed)
 Copied from CRM [id number]. Topic: Clinical - Medication Refill >> Jun 01, 2024  8:06 AM Suzen RAMAN wrote: Medication: methylphenidate  (RITALIN  LA) 40 MG 24 hr capsule  Has the patient contacted their pharmacy? Yes  This is the patient's preferred pharmacy:  [facility] [address] [facility] - [place], [place] - [address] [facility] [address] [facility] [address] [place] [address] Phone: [phone] Fax: [phone]  Is this the correct pharmacy for this prescription? Yes If no, delete pharmacy and type the correct one.   Has the prescription been filled recently? No  Is the patient out of the medication? Yes  Has the patient been seen for an appointment in the last year OR does the patient have an upcoming appointment? Yes  Can we respond through MyChart? Yes  Agent: Please be advised that Rx refills may take up to 3 business days. We ask that you follow-up with your pharmacy.

## 2024-06-02 NOTE — Telephone Encounter (Signed)
 Requested medications are due for refill today.  yes  Requested medications are on the active medications list.  yes  Last refill. 05/04/2024 #30 0 rf  Future visit scheduled.   no  Notes to clinic.  Refill not delegated.    Requested Prescriptions  Pending Prescriptions Disp Refills   methylphenidate  (RITALIN  LA) 40 MG 24 hr capsule 30 capsule 0    Sig: Take 1 capsule (40 mg total) by mouth every morning.     Not Delegated - Psychiatry:  Stimulants/ADHD Failed - 06/02/2024  5:49 PM      Failed - This refill cannot be delegated      Failed - Urine Drug Screen completed in last 360 days      Failed - Valid encounter within last 6 months    Recent Outpatient Visits   None            Passed - Last BP in normal range    BP Readings from Last 1 Encounters:  12/09/23 136/83         Passed - Last Heart Rate in normal range    Pulse Readings from Last 1 Encounters:  12/09/23 74

## 2024-06-04 MED ORDER — METHYLPHENIDATE HCL ER (LA) 40 MG PO CP24
40.0000 mg | ORAL_CAPSULE | ORAL | 0 refills | Status: DC
Start: 2024-06-04 — End: 2024-07-04

## 2024-07-04 ENCOUNTER — Other Ambulatory Visit: Payer: Self-pay | Admitting: Family Medicine

## 2024-07-04 DIAGNOSIS — F902 Attention-deficit hyperactivity disorder, combined type: Secondary | ICD-10-CM

## 2024-07-04 NOTE — Telephone Encounter (Signed)
 Copied from CRM [id number]. Topic: Clinical - Medication Refill >> Jul 04, 2024 10:14 AM Delon HERO wrote: Medication: methylphenidate  (RITALIN  LA) 40 MG 24 hr capsule [[id number]]  Has the patient contacted their pharmacy? Yes (Agent: If no, request that the patient contact the pharmacy for the refill. If patient does not wish to contact the pharmacy document the reason why and proceed with request.) (Agent: If yes, when and what did the pharmacy advise?)  This is the patient's preferred pharmacy:  [facility] [address] [facility] - [place], [place] - [address] [facility] [address] [facility] [address] [place] [address] Phone: [phone] Fax: [phone]  Is this the correct pharmacy for this prescription? Yes If no, delete pharmacy and type the correct one.   Has the prescription been filled recently? Yes  Is the patient out of the medication? Yes  Has the patient been seen for an appointment in the last year OR does the patient have an upcoming appointment? Yes  Can we respond through MyChart? Yes  Agent: Please be advised that Rx refills may take up to 3 business days. We ask that you follow-up with your pharmacy.

## 2024-07-05 ENCOUNTER — Other Ambulatory Visit: Payer: Self-pay | Admitting: Family Medicine

## 2024-07-05 DIAGNOSIS — F902 Attention-deficit hyperactivity disorder, combined type: Secondary | ICD-10-CM

## 2024-07-05 MED ORDER — METHYLPHENIDATE HCL ER (LA) 40 MG PO CP24: 40.0000 mg | ORAL_CAPSULE | ORAL | 0 refills | Status: AC

## 2024-07-05 NOTE — Telephone Encounter (Signed)
 Requested medication (s) are due for refill today: yes  Requested medication (s) are on the active medication list: yes  Last refill:  06/04/24  Future visit scheduled: no  Notes to clinic:  Unable to refill per protocol, cannot delegate.      Requested Prescriptions  Pending Prescriptions Disp Refills   methylphenidate  (RITALIN  LA) 40 MG 24 hr capsule 30 capsule 0    Sig: Take 1 capsule (40 mg total) by mouth every morning.     Not Delegated - Psychiatry:  Stimulants/ADHD Failed - 07/05/2024  1:33 PM      Failed - This refill cannot be delegated      Failed - Urine Drug Screen completed in last 360 days      Failed - Valid encounter within last 6 months    Recent Outpatient Visits   None            Passed - Last BP in normal range    BP Readings from Last 1 Encounters:  12/09/23 136/83         Passed - Last Heart Rate in normal range    Pulse Readings from Last 1 Encounters:  12/09/23 74

## 2024-07-08 DIAGNOSIS — Z79899 Other long term (current) drug therapy: Secondary | ICD-10-CM | POA: Diagnosis not present

## 2024-07-08 DIAGNOSIS — Z0000 Encounter for general adult medical examination without abnormal findings: Secondary | ICD-10-CM | POA: Diagnosis not present

## 2024-07-08 DIAGNOSIS — F909 Attention-deficit hyperactivity disorder, unspecified type: Secondary | ICD-10-CM | POA: Diagnosis not present

## 2024-07-08 DIAGNOSIS — Z87442 Personal history of urinary calculi: Secondary | ICD-10-CM | POA: Diagnosis not present

## 2024-07-08 DIAGNOSIS — Z125 Encounter for screening for malignant neoplasm of prostate: Secondary | ICD-10-CM | POA: Diagnosis not present

## 2024-07-08 DIAGNOSIS — Z1331 Encounter for screening for depression: Secondary | ICD-10-CM | POA: Diagnosis not present

## 2024-07-08 DIAGNOSIS — E782 Mixed hyperlipidemia: Secondary | ICD-10-CM | POA: Diagnosis not present
